# Patient Record
Sex: Female | Born: 1935 | Race: White | Hispanic: No | Smoking: Never smoker
Health system: Southern US, Community
[De-identification: ages and names within clinical notes are randomized; demographics above are authoritative.]

## PROBLEM LIST (undated history)

## (undated) DIAGNOSIS — D649 Anemia, unspecified: Secondary | ICD-10-CM

## (undated) DIAGNOSIS — E119 Type 2 diabetes mellitus without complications: Secondary | ICD-10-CM

## (undated) HISTORY — PX: COLOSTOMY: SHX63

---

## 2008-02-19 ENCOUNTER — Ambulatory Visit: Payer: Self-pay | Admitting: Infectious Disease

## 2008-02-19 DIAGNOSIS — Z9089 Acquired absence of other organs: Secondary | ICD-10-CM

## 2008-02-19 DIAGNOSIS — T8489XA Other specified complication of internal orthopedic prosthetic devices, implants and grafts, initial encounter: Secondary | ICD-10-CM | POA: Insufficient documentation

## 2008-02-19 DIAGNOSIS — A4902 Methicillin resistant Staphylococcus aureus infection, unspecified site: Secondary | ICD-10-CM | POA: Insufficient documentation

## 2008-02-19 LAB — CONVERTED CEMR LAB
ALT: 11 units/L (ref 0–35)
Albumin: 3.8 g/dL (ref 3.5–5.2)
CO2: 25 meq/L (ref 19–32)
CRP: 0.7 mg/dL — ABNORMAL HIGH (ref <0.6)
Glucose, Bld: 88 mg/dL (ref 70–99)
Lymphocytes Relative: 32 % (ref 12–46)
Lymphs Abs: 1.9 10*3/uL (ref 0.7–4.0)
Monocytes Relative: 17 % — ABNORMAL HIGH (ref 3–12)
Neutro Abs: 2.4 10*3/uL (ref 1.7–7.7)
Neutrophils Relative %: 40 % — ABNORMAL LOW (ref 43–77)
Potassium: 4.5 meq/L (ref 3.5–5.3)
RBC: 3.47 M/uL — ABNORMAL LOW (ref 3.87–5.11)
Sed Rate: 81 mm/hr — ABNORMAL HIGH (ref 0–22)
Sodium: 139 meq/L (ref 135–145)
Total Protein: 7.3 g/dL (ref 6.0–8.3)
WBC: 6 10*3/uL (ref 4.0–10.5)

## 2008-02-23 ENCOUNTER — Encounter: Payer: Self-pay | Admitting: Internal Medicine

## 2008-04-19 ENCOUNTER — Ambulatory Visit: Payer: Self-pay | Admitting: Infectious Disease

## 2008-04-19 DIAGNOSIS — M25469 Effusion, unspecified knee: Secondary | ICD-10-CM

## 2008-04-19 LAB — CONVERTED CEMR LAB
ALT: 10 units/L (ref 0–35)
AST: 19 units/L (ref 0–37)
Basophils Absolute: 0 10*3/uL (ref 0.0–0.1)
CO2: 28 meq/L (ref 19–32)
CRP: 1 mg/dL — ABNORMAL HIGH (ref <0.6)
Creatinine, Ser: 1.26 mg/dL — ABNORMAL HIGH (ref 0.40–1.20)
Eosinophils Absolute: 0.2 10*3/uL (ref 0.0–0.7)
Eosinophils Relative: 3 % (ref 0–5)
Lymphocytes Relative: 41 % (ref 12–46)
Lymphs Abs: 2.4 10*3/uL (ref 0.7–4.0)
MCV: 98.2 fL (ref 78.0–100.0)
Neutrophils Relative %: 35 % — ABNORMAL LOW (ref 43–77)
Platelets: 248 10*3/uL (ref 150–400)
RDW: 15.1 % (ref 11.5–15.5)
Sed Rate: 106 mm/hr — ABNORMAL HIGH (ref 0–22)
Sodium: 139 meq/L (ref 135–145)
Total Bilirubin: 0.2 mg/dL — ABNORMAL LOW (ref 0.3–1.2)
Total Protein: 7.1 g/dL (ref 6.0–8.3)
WBC: 5.8 10*3/uL (ref 4.0–10.5)

## 2008-08-10 ENCOUNTER — Inpatient Hospital Stay (HOSPITAL_COMMUNITY): Admission: RE | Admit: 2008-08-10 | Discharge: 2008-08-13 | Payer: Self-pay | Admitting: Orthopedic Surgery

## 2008-08-18 ENCOUNTER — Ambulatory Visit (HOSPITAL_COMMUNITY): Admission: RE | Admit: 2008-08-18 | Discharge: 2008-08-18 | Payer: Self-pay | Admitting: Orthopedic Surgery

## 2008-10-05 ENCOUNTER — Inpatient Hospital Stay (HOSPITAL_COMMUNITY): Admission: RE | Admit: 2008-10-05 | Discharge: 2008-10-08 | Payer: Self-pay | Admitting: Orthopedic Surgery

## 2009-09-05 ENCOUNTER — Inpatient Hospital Stay (HOSPITAL_COMMUNITY): Admission: RE | Admit: 2009-09-05 | Discharge: 2009-09-08 | Payer: Self-pay | Admitting: Orthopedic Surgery

## 2010-09-25 LAB — BASIC METABOLIC PANEL
BUN: 17 mg/dL (ref 6–23)
BUN: 28 mg/dL — ABNORMAL HIGH (ref 6–23)
CO2: 25 mEq/L (ref 19–32)
CO2: 30 mEq/L (ref 19–32)
Chloride: 108 mEq/L (ref 96–112)
Chloride: 109 mEq/L (ref 96–112)
Chloride: 98 mEq/L (ref 96–112)
GFR calc non Af Amer: 43 mL/min — ABNORMAL LOW (ref >60)
GFR calc non Af Amer: 60 mL/min — ABNORMAL LOW (ref >60)
Glucose, Bld: 105 mg/dL — ABNORMAL HIGH (ref 70–99)
Glucose, Bld: 87 mg/dL (ref 70–99)
Potassium: 3.7 mEq/L (ref 3.5–5.1)
Potassium: 3.7 mEq/L (ref 3.5–5.1)
Potassium: 4.4 mEq/L (ref 3.5–5.1)
Sodium: 140 mEq/L (ref 135–145)
Sodium: 140 mEq/L (ref 135–145)

## 2010-09-25 LAB — URINALYSIS, ROUTINE W REFLEX MICROSCOPIC
Bilirubin Urine: NEGATIVE
Glucose, UA: NEGATIVE mg/dL
Hgb urine dipstick: NEGATIVE
Ketones, ur: NEGATIVE mg/dL
Nitrite: NEGATIVE
Specific Gravity, Urine: 1.009 (ref 1.005–1.030)
pH: 6.5 (ref 5.0–8.0)

## 2010-09-25 LAB — CBC
HCT: 28.5 % — ABNORMAL LOW (ref 36.0–46.0)
HCT: 28.6 % — ABNORMAL LOW (ref 36.0–46.0)
HCT: 38.4 % (ref 36.0–46.0)
Hemoglobin: 12.9 g/dL (ref 12.0–15.0)
Hemoglobin: 9.2 g/dL — ABNORMAL LOW (ref 12.0–15.0)
MCHC: 31.9 g/dL (ref 30.0–36.0)
MCV: 97.4 fL (ref 78.0–100.0)
MCV: 98.9 fL (ref 78.0–100.0)
MCV: 99.9 fL (ref 78.0–100.0)
Platelets: 129 10*3/uL — ABNORMAL LOW (ref 150–400)
Platelets: 160 10*3/uL (ref 150–400)
RDW: 13.9 % (ref 11.5–15.5)
RDW: 14.1 % (ref 11.5–15.5)
WBC: 10.2 10*3/uL (ref 4.0–10.5)
WBC: 10.3 10*3/uL (ref 4.0–10.5)

## 2010-09-25 LAB — DIFFERENTIAL
Basophils Absolute: 0 10*3/uL (ref 0.0–0.1)
Eosinophils Absolute: 0.2 10*3/uL (ref 0.0–0.7)
Eosinophils Relative: 4 % (ref 0–5)
Lymphocytes Relative: 32 % (ref 12–46)
Monocytes Absolute: 1 10*3/uL (ref 0.1–1.0)

## 2010-09-25 LAB — GLUCOSE, CAPILLARY
Glucose-Capillary: 127 mg/dL — ABNORMAL HIGH (ref 70–99)
Glucose-Capillary: 137 mg/dL — ABNORMAL HIGH (ref 70–99)
Glucose-Capillary: 171 mg/dL — ABNORMAL HIGH (ref 70–99)

## 2010-09-25 LAB — TYPE AND SCREEN
ABO/RH(D): B NEG
Antibody Screen: NEGATIVE

## 2010-10-11 LAB — GRAM STAIN

## 2010-10-11 LAB — TYPE AND SCREEN

## 2010-10-11 LAB — CBC
Hemoglobin: 9.3 g/dL — ABNORMAL LOW (ref 12.0–15.0)
MCHC: 33.6 g/dL (ref 30.0–36.0)
MCV: 88.7 fL (ref 78.0–100.0)
Platelets: 94 10*3/uL — ABNORMAL LOW (ref 150–400)
Platelets: 97 10*3/uL — ABNORMAL LOW (ref 150–400)
RBC: 2.9 MIL/uL — ABNORMAL LOW (ref 3.87–5.11)
RDW: 27.6 % — ABNORMAL HIGH (ref 11.5–15.5)
WBC: 11.5 10*3/uL — ABNORMAL HIGH (ref 4.0–10.5)

## 2010-10-11 LAB — HEMOGLOBIN AND HEMATOCRIT, BLOOD
HCT: 24.1 % — ABNORMAL LOW (ref 36.0–46.0)
HCT: 25.2 % — ABNORMAL LOW (ref 36.0–46.0)
HCT: 27.4 % — ABNORMAL LOW (ref 36.0–46.0)
Hemoglobin: 8 g/dL — ABNORMAL LOW (ref 12.0–15.0)
Hemoglobin: 8.4 g/dL — ABNORMAL LOW (ref 12.0–15.0)
Hemoglobin: 9.1 g/dL — ABNORMAL LOW (ref 12.0–15.0)

## 2010-10-11 LAB — BASIC METABOLIC PANEL
BUN: 27 mg/dL — ABNORMAL HIGH (ref 6–23)
CO2: 26 mEq/L (ref 19–32)
Calcium: 7.6 mg/dL — ABNORMAL LOW (ref 8.4–10.5)
Calcium: 7.6 mg/dL — ABNORMAL LOW (ref 8.4–10.5)
Creatinine, Ser: 1.1 mg/dL (ref 0.4–1.2)
Creatinine, Ser: 1.21 mg/dL — ABNORMAL HIGH (ref 0.4–1.2)
GFR calc Af Amer: 58 mL/min — ABNORMAL LOW (ref >60)
GFR calc non Af Amer: 43 mL/min — ABNORMAL LOW (ref >60)
GFR calc non Af Amer: 48 mL/min — ABNORMAL LOW (ref >60)
Glucose, Bld: 117 mg/dL — ABNORMAL HIGH (ref 70–99)
Sodium: 133 mEq/L — ABNORMAL LOW (ref 135–145)

## 2010-10-11 LAB — CULTURE, ROUTINE-ABSCESS

## 2010-10-11 LAB — GLUCOSE, CAPILLARY
Glucose-Capillary: 128 mg/dL — ABNORMAL HIGH (ref 70–99)
Glucose-Capillary: 145 mg/dL — ABNORMAL HIGH (ref 70–99)
Glucose-Capillary: 158 mg/dL — ABNORMAL HIGH (ref 70–99)
Glucose-Capillary: 213 mg/dL — ABNORMAL HIGH (ref 70–99)

## 2010-10-11 LAB — ANAEROBIC CULTURE

## 2010-10-12 LAB — URINALYSIS, ROUTINE W REFLEX MICROSCOPIC
Glucose, UA: NEGATIVE mg/dL
Ketones, ur: NEGATIVE mg/dL
Protein, ur: NEGATIVE mg/dL
Urobilinogen, UA: 0.2 mg/dL (ref 0.0–1.0)

## 2010-10-12 LAB — PROTIME-INR: Prothrombin Time: 13.8 seconds (ref 11.6–15.2)

## 2010-10-12 LAB — BASIC METABOLIC PANEL
BUN: 28 mg/dL — ABNORMAL HIGH (ref 6–23)
Chloride: 102 mEq/L (ref 96–112)
Creatinine, Ser: 0.94 mg/dL (ref 0.4–1.2)
GFR calc non Af Amer: 58 mL/min — ABNORMAL LOW (ref >60)
Glucose, Bld: 132 mg/dL — ABNORMAL HIGH (ref 70–99)
Potassium: 5.3 mEq/L — ABNORMAL HIGH (ref 3.5–5.1)

## 2010-10-12 LAB — CBC
HCT: 39.1 % (ref 36.0–46.0)
MCV: 97 fL (ref 78.0–100.0)
Platelets: 199 10*3/uL (ref 150–400)
RDW: 15 % (ref 11.5–15.5)

## 2010-10-12 LAB — DIFFERENTIAL
Basophils Absolute: 0 10*3/uL (ref 0.0–0.1)
Eosinophils Absolute: 0.6 10*3/uL (ref 0.0–0.7)
Eosinophils Relative: 9 % — ABNORMAL HIGH (ref 0–5)
Neutrophils Relative %: 30 % — ABNORMAL LOW (ref 43–77)

## 2010-10-17 LAB — TYPE AND SCREEN
ABO/RH(D): B NEG
Antibody Screen: NEGATIVE
Antibody Screen: NEGATIVE

## 2010-10-17 LAB — VANCOMYCIN, TROUGH: Vancomycin Tr: 17 ug/mL (ref 10.0–20.0)

## 2010-10-17 LAB — BASIC METABOLIC PANEL
BUN: 23 mg/dL (ref 6–23)
CO2: 25 mEq/L (ref 19–32)
CO2: 30 mEq/L (ref 19–32)
Calcium: 8 mg/dL — ABNORMAL LOW (ref 8.4–10.5)
Calcium: 8.3 mg/dL — ABNORMAL LOW (ref 8.4–10.5)
Chloride: 100 mEq/L (ref 96–112)
Creatinine, Ser: 1.04 mg/dL (ref 0.4–1.2)
GFR calc Af Amer: >60 mL/min (ref >60)
GFR calc Af Amer: >60 mL/min (ref >60)
GFR calc non Af Amer: 60 mL/min — ABNORMAL LOW (ref >60)
GFR calc non Af Amer: >60 mL/min (ref >60)
Glucose, Bld: 126 mg/dL — ABNORMAL HIGH (ref 70–99)
Glucose, Bld: 84 mg/dL (ref 70–99)
Sodium: 135 mEq/L (ref 135–145)
Sodium: 136 mEq/L (ref 135–145)

## 2010-10-17 LAB — URINALYSIS, ROUTINE W REFLEX MICROSCOPIC
Bilirubin Urine: NEGATIVE
Glucose, UA: NEGATIVE mg/dL
Hgb urine dipstick: NEGATIVE
Ketones, ur: NEGATIVE mg/dL
Protein, ur: NEGATIVE mg/dL

## 2010-10-17 LAB — CBC
Hemoglobin: 7.4 g/dL — CL (ref 12.0–15.0)
Hemoglobin: 8.7 g/dL — ABNORMAL LOW (ref 12.0–15.0)
MCHC: 33.6 g/dL (ref 30.0–36.0)
MCV: 97.8 fL (ref 78.0–100.0)
Platelets: 151 10*3/uL (ref 150–400)
Platelets: 202 10*3/uL (ref 150–400)
RBC: 2.24 MIL/uL — ABNORMAL LOW (ref 3.87–5.11)
RBC: 3.39 MIL/uL — ABNORMAL LOW (ref 3.87–5.11)
RDW: 14 % (ref 11.5–15.5)
WBC: 8.7 10*3/uL (ref 4.0–10.5)

## 2010-10-17 LAB — DIFFERENTIAL
Lymphocytes Relative: 27 % (ref 12–46)
Lymphs Abs: 1.7 10*3/uL (ref 0.7–4.0)
Neutrophils Relative %: 53 % (ref 43–77)

## 2010-10-17 LAB — CULTURE, ROUTINE-ABSCESS

## 2010-10-17 LAB — PROTIME-INR
INR: 1.1 (ref 0.00–1.49)
Prothrombin Time: 15 seconds (ref 11.6–15.2)

## 2010-10-17 LAB — GRAM STAIN

## 2010-10-17 LAB — ANAEROBIC CULTURE

## 2010-10-17 LAB — APTT: aPTT: 32 seconds (ref 24–37)

## 2010-10-17 LAB — GLUCOSE, CAPILLARY: Glucose-Capillary: 82 mg/dL (ref 70–99)

## 2010-11-14 NOTE — Discharge Summary (Signed)
[name], [name]                ACCOUNT NO.:  [id number]   MEDICAL RECORD NO.:  [id number]          PATIENT TYPE:  INP   LOCATION:  1618                         FACILITY:  [facility]   PHYSICIAN:  Madlyn Frankel. Charlann Boxer, M.D.  DATE OF BIRTH:  1929-06-12   DATE OF ADMISSION:  10/05/2008  DATE OF DISCHARGE:  10/08/2008                               DISCHARGE SUMMARY   ADMISSION DIAGNOSES:  1. Osteoarthritis.  2. Hypertension.  3. Diabetes.  4. Anemia.  5. Deep venous thrombosis in her neck.  6. Colorectal cancer.  7. Rheumatoid arthritis.  8. Staph infections.   DISCHARGE DIAGNOSES:  1. Osteoarthritis.  2. Hypertension.  3. Diabetes.  4. Anemia.  5. Deep venous thrombosis in her neck.  6. Colorectal cancer.  7. Rheumatoid arthritis.  8. Staph infections.  9. Acute blood loss anemia.   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  She was admitted to the  hospital after long-term oral and IV antibiotics for reimplantation of a  right total knee replacement.   PROCEDURE:  Reimplantation of her right total knee replacement.   SURGEON:  Madlyn Frankel. Charlann Boxer, M.D.   ASSISTANT:  Yetta Glassman. Mann, PA-C.   CONSULTATION:  None.   LABORATORY DATA:  CBC:  Hemoglobin/hematocrit drawn on October 08, 2008  showed hemoglobin 8.4, hematocrit 25.2.  She was retransfused 2 units of  packed red blood cells prior to discharge.  Metabolic panel:  Sodium  130, potassium 3.5, BUN 27, creatinine 1.1, glucose 131.  Culture  abscess showed no growth after 3 days.  No anaerobes isolated from wound  culture.   DIAGNOSTICS:  Chest two view showed mild cardiomegaly, no active  process.   HOSPITAL COURSE:  The patient admitted to the hospital for  reimplantation of right total knee replacement.  Surgeon was Dr. Durene Romans.  Assistant was [employer]riott.  During her course of stay, she did  have some acute blood loss anemia and was  transfused several units of  blood.  She has chronic anemia.  Dressing was changed from her wound.  No significant drainage from her wound.  No significant bruising.  No  sign of hematoma or infection.  She did remain neurovascularly intact of  her right lower extremity throughout her course of stay.  She did have  deconditioned quads, but did have improving quad firing.  We  discontinued her PCA on October 07, 2008 as well as her MiraLax.  When  seen on October 08, 2008, wound looked good with no significant drainage.  She was afebrile and feeling better than she was the previous day.  We  are going to recheck her H and H which we did, it showed decreasing  hematocrit at 25.2.  We were going to retransfuse her 2 units of packed  red blood cells and then discharge her to skilled nursing facility late  in the day.   DISCHARGE DISPOSITION:  Discharged to skilled nurse facility rehab in  stable and improved condition.   DISCHARGE  DIET:  Heart healthy.   DISCHARGE WOUND CARE:  Keep dry.   DISCHARGE PHYSICAL THERAPY:  She is weightbearing as tolerated with the  use of a rolling walker.  Goals range of motion 0-120 as well as  increased quad strength.  She is weak and will require significant  assistance in achieving goals as well as working on her balance  exercises.   DISCHARGE MEDICATIONS:  1. Lovenox 40 mg subcutaneous q.24 h. x 10 days.  2. Then start enteric-coated aspirin 325 mg 1 p.o. daily x4 weeks      after Lovenox completed.  3. Robaxin 500 mg 1 p.o. q.6 h. p.r.n. muscle spasm and pain.  4. Cyanocobalamin 100,000 mg injection monthly.  5. Magnesium 400 mg 1 p.o. q.a.m.  6. Metoprolol 50 mg 1 p.o. q.a.m.  7. Omeprazole 20 mg 1 p.o. q.p.m.  8. Potassium chloride 20 mEq p.o. daily.  9. Simvastatin 40 mg p.o. q.p.m.  10.Calcium plus vitamin D b.i.d.  11.Colace b.i.d. p.r.n.  12.Iron 325 mg t.i.d.  13.Lyrica 50 mg 1 p.o. t.i.d.  14.Tylenol 325 mg up to 4 times daily as  needed.  15.Oxycodone 5 mg 1-3 p.o. q.3-4 h. p.r.n. pain.   DISCHARGE FOLLOWUP:  Follow up with Dr. Charlann Boxer at phone number [phone] in  2 weeks for wound check.     ______________________________  Yetta Glassman. [name], [name]      Madlyn Frankel. Charlann Boxer, M.D.  Electronically Signed    BLM/MEDQ  D:  10/08/2008  T:  10/08/2008  Job:  [id number]

## 2010-11-14 NOTE — H&P (Signed)
[name], [name]                ACCOUNT NO.:  [id number]   MEDICAL RECORD NO.:  [id number]         PATIENT TYPE:  LINP   LOCATION:                               FACILITY:  [facility]   PHYSICIAN:  Madlyn Frankel. Charlann Boxer, M.D.  DATE OF BIRTH:  1928/07/13   DATE OF ADMISSION:  07/19/2008  DATE OF DISCHARGE:                              HISTORY & PHYSICAL   PROCEDURE:  Resection of an infected right total knee replacement.   CHIEF COMPLAINTS:  Right knee infection and pain.   HISTORY OF PRESENT ILLNESS:  A 77 year old female with a history of  right knee infection.  It has been refractory to all conservative  treatment.  She had been on some long-term oral antibiotics without  success.  She is a resident of [address].   PRIMARY CARE PHYSICIAN:  Dr. [name].   PAST MEDICAL HISTORY:  1. Ostearthritis.  2. Hypertension.  3. Diabetes.  4. Anemia.  5. DVT in her neck.  6. Colorectal cancer.  7. Rheumatoid arthritis.  8. Staph infections.   PAST SURGICAL HISTORY:  1. In 1997 colorectal/colostomy.  2. Knee replacement in 2001.  3. Spleen and gallbladder surgery in 1997.  4. Cataract surgery in 1999.  5. Hysterectomy in 1983.   FAMILY HISTORY:  Noncontributory.   SOCIAL HISTORY:  Widowed, resident at [facility], [place].   DRUG ALLERGIES:  No known drug allergies.   MEDICATIONS:  1. Calcium 600 mg 1 b.i.d.  2. Doxycycline 100 mg 1 b.i.d.  3. Vitamin B12 injection q. monthly 6000 mcg.  4. HCTZ 25 mg p.o. daily.  5. Lyrica 15 mg 1 t.i.d.  6. Magnesium oxide 400 mg 1 q.i.d.  7. Metoprolol 50 mg ER 1 daily.  8. Omeprazole 20 mg 1 daily.  9. Potassium chloride 20 mEq 1 daily.  10.Simvastatin 40 mg 1 q.h.s.  11.Maalox p.r.n. up to 4 times a day.  12.Imodium 1 tablet up to 6 doses daily.  13.Darvocet N 100 1 q.6 pain  14.Triamcinolone cream (Aristocort) daily for rash as needed.   REVIEW OF SYSTEMS:  HEENT:  She wears dentures.  She has ringing in her   ears.  GENITOURINARY:  She has increased urinary frequency.  MUSCULOSKELETAL:  She has joint swelling of her feet.  Otherwise see  HPI.   PHYSICAL EXAMINATION:  VITAL SIGNS:  Pulse 54, respirations 16, blood  pressure 150/70.  GENERAL:  Wake alert and oriented, utilizes a wheelchair.  HEENT:  Normocephalic.  NECK:  Supple.  No carotid bruits.  CHEST:  Lungs clear to auscultation bilaterally.  BREASTS:  Deferred.  HEART:  Regular rate and rhythm.  S1-S2 distinct.  ABDOMEN:  Soft, nontender, nondistended.  Bowel sounds present.  PELVIC:  Stable.  GENITOURINARY:  Deferred.  EXTREMITIES:  Right knee is swollen and painful.  SKIN:  Warm to touch.  NEUROLOGIC:  Intact distal sensibilities.   LABORATORY DATA:  Labs, EKG, chest x-ray all pending.   IMPRESSION:  Infected right total knee replacement.   PLAN OF ACTION:  Resection right total knee replacement at [facility], July 19, 2008, by Dr. Durene Romans.  Risks and complications were discussed.   She is planning on a rehab stay postoperatively.     ______________________________  [name], [name]      Madlyn Frankel. Charlann Boxer, M.D.  Electronically Signed    BLM/MEDQ  D:  07/09/2008  T:  07/09/2008  Job:  [id number]   cc:   Darrick Huntsman [place]

## 2010-11-14 NOTE — Op Note (Signed)
[name], [name]                ACCOUNT NO.:  [id number]   MEDICAL RECORD NO.:  [id number]          PATIENT TYPE:  INP   LOCATION:  0002                         FACILITY:  [facility]   PHYSICIAN:  Madlyn Frankel. Charlann Boxer, M.D.  DATE OF BIRTH:  1928/10/18   DATE OF PROCEDURE:  10/05/2008  DATE OF DISCHARGE:                               OPERATIVE REPORT   PREOPERATIVE DIAGNOSIS:  Status post resection of a right total knee  that was infected.   POSTOPERATIVE DIAGNOSIS:  Status post resection of a right total knee  that was infected.   PROCEDURE:  Reimplantation of a right total knee replacement utilizing  DePuy components with a size 3 TC3 femur, a size 31 Porocoat Universal  femoral distal sleeve with a +2 offset adapter bolt and a 5 degree  adapter with a 60-mm sleeve.  On the tibial side was a size 3 tibial  component with a 15 mm size 2 medial wedge, a 10 mm size 3 lateral wedge  and a 29 mm cemented sleeve with a 60-mm stem.  On the femoral side, I  did use an 8 mm posterior medial augment, as well as an 8 mm distal  lateral augment and a 12.5 TC3 insert and a 38 patellar button.  I did  use 4 bags of cement and 2 grams of vancomycin for prophylaxis.   SURGEON:  Madlyn Frankel. Charlann Boxer, M.D.   ASSISTANT:  Yetta Glassman. [name], PA.   ANESTHESIA:  General.   SPECIMENS:  Joint fluid was sent for Gram stain.  At the end of the  case, there was no evidence of bacterial growth, minimal white cells.   BLOOD LOSS:  600 mL.   TOURNIQUET TIME:  35 minutes at 250 mmHg.   DRAINS:  One Hemovac.   INDICATIONS FOR PROCEDURE:  Ms. Ketner is a 77 year old female who is  currently almost 2 months out from resection of a right total knee,  where there was concern for infection and placement of antibiotic  spacer.  She received 6 weeks of IV antibiotics and scheduled for  surgery 2-3 weeks post antibiotic cessation.  She has been afebrile with  no recurrence of her swelling and is ready for planned  procedure.  The  risks of recurrence of infection, DVT, component failure, need for  revision and stiffness were all reviewed and discussed, as well as the  need for potential revision surgery.  Consent was obtained for the  benefit of pain relief and improved function.   PROCEDURE IN DETAIL:  The patient was brought to the operative theater.  Once adequate anesthesia, preoperative antibiotics were held until the  incision time.  The right lower extremity was prepped with a proximal  thigh tourniquet.  We then pre-scrubbed and prepped and draped the right  leg in the leg holder.   A timeout was performed identifying the planned procedure, the patient  and extremity.   The leg was exsanguinated and tourniquet was initially elevated to 250  mmHg.  I made my initial incision a little bit further proximal distal  than the initial incision.  Sharp dissection was carried down to the  extensor mechanism.  It was about this time that I recognized that there  was more of a venous tourniquet, so I decided to let the tourniquet down  after just a few minutes.   Soft tissue planes were created along the extensor mechanism, then  arthrotomy carried out.  There was noted to be bloody hemarthrosis, but  no evidence of infection.  Gram stain culture was taken at this point.  Antibiotics were given at this point.   Following exposure, the cement block was removed without difficulty or  complication.  Following further exposure, I was able to flex the knee  up.  Initial attention was directed to the patella.  I debrided scar  tissue around the patella.  I used the curette to define the surface of  the patella.  I used an oscillating saw to remove some of the distal  bone that had measured approximately 18 mm.  I resected it down to 14  mm, giving a decent surface for size 38 patellar button.  Lug holes were  drilled.  I placed a patella button on this to protect the cut surface  from retractors and  saw blades.   The knee was then flexed forward following debridement.  The canals were  identified both on the femoral and tibial side and then reamed up to 15  mm on each side for cemented stems of 13 mm diameter.   Attention was first directed to the tibia side.  Given the defect that  was noted, as well as the potential need for augment, I went ahead and  prepared the tibia for a sleeve.  I impacted this down and using trial  components, determined that I was going to need at least a 10 mm augment  on the medial side and a 5 lateral to get contact, as well as to keep  the tibial component within a perpendicular plane and no varus.   Given this preparation of the tibia, I did initially trial with a 10 mm  augment on the medial side and a 5 mm on the lateral side.  A 30 mm  femoral stem with a 29-mm sleeve placed directly anterior.   Following this, attention was now directed to the femur.  I used a 13 mm  intramedullary reamer into the canal with a 14 mm distal sleeve.  I  checked, there was significant bone loss on the lateral side of the  femur.  There was a distal medial portion of the femur intact that I  utilized as a reference for my joint.  Based on these findings, I  determined that I was going to need to use a sleeveless component to  maintain rotational control and support.   Given this, I went ahead and prepared the femur for a sleeve drilling  the proximal femur carefully and then using a broach to impact it at the  appropriate level for a TC3 component.  There was noted to be a severe  gap on the lateral side that was bone deficient, which I was fine with  this at this point.   I then checked, there was no anterior cut to be made.  There was no real  posterior cut to be made, and I determined I would use an 8 mm posterior  medial augment in order to help with rotation based off the plane of the  tibial component.   At this point, we used a  60 mm stem, a 31 mm  sleeve, a +2 adapter, this  8 mm posterolateral medial augment.  With this, I did a trial reduction.  The femoral component sat basically at the end of this distal femoral  bone structure and the tibia component was then placed.  It was at least  up to 15 with some hyperextension.  At this point, I made the  determination that I would increase the augments on the tibial side  rather than to try to increase the joint surface because I needed some  bony contact on the posterior aspect of the remaining femur for  rotational control.   At this point, all trial components were removed.  With the trial  components in place, I did at this point re-exsanguinate the leg and  elevate the tourniquet to 250 mmHg.  We spent time with a canal brush  irrigating the canals first, the canals on both the femoral and tibial  side.  I then put cement restrictor size 4, as appropriate depth based  on the trials.   A brush irrigator was then used to irrigate the knee out as the final  components were opened and prepared by myself on the back table.  Once  these were prepared, cement was mixed, again four batches of cement and  2 grams of vancomycin.  The tibial component was cemented in first  followed by the femur.  The knee was brought to extension with a 12.5  insert.  With this, the knee was had good extension and was stable from  extension to flexion.   The extruded cement was removed.  Once the cement had fully cured,  excessive cement was removed throughout the knee.  I made a decision  based on this 77 year old female to go ahead and use a size 12.5 TC3  tibial insert to provide more support to the knee.   The knee was then irrigated with normal saline solution.  The tourniquet  was let down after the cement was in place.  We did place a medium  Hemovac drain.  The extensor mechanism was then reapproximated with the  knee in approximately 30 degrees of flexion with #1 Vicryl.  The  remaining  wound was closed with 2-0 Vicryl and staples on the skin.  The  skin was cleaned, dried and dressed sterilely with a bulky sterile wrap.  She was brought to the recovery room in stable condition tolerating the  procedure well.      Madlyn Frankel Charlann Boxer, M.D.  Electronically Signed     MDO/MEDQ  D:  10/05/2008  T:  10/05/2008  Job:  [id number]

## 2010-11-14 NOTE — Discharge Summary (Signed)
[name], [name]                ACCOUNT NO.:  [id number]   MEDICAL RECORD NO.:  [id number]          PATIENT TYPE:  INP   LOCATION:  1614                         FACILITY:  [facility]   PHYSICIAN:  Madlyn Frankel. Charlann Boxer, M.D.  DATE OF BIRTH:  1929/05/07   DATE OF ADMISSION:  08/10/2008  DATE OF DISCHARGE:  08/13/2008                               DISCHARGE SUMMARY   ADMITTING DIAGNOSES:  1. Infected right total knee.  2. Hypertension.  3. Reflux disease.  4. Dyslipidemia.  5. Diabetes.  6. Colorectal cancer.  7. Rheumatoid arthritis.  8. History of blood clot in neck.  9. Postmenopausal.   DISCHARGE DIAGNOSIS:  1. Infected with a resected right total knee on IV antibiotics.  2. Hypertension.  3. Dyslipidemia.  4. Reflux disease.  5. Diabetes.  6. Colorectal cancer.  7. Rheumatoid arthritis.  8. History of blood clot in neck.  9. Postmenopausal.   HISTORY OF PRESENT ILLNESS:  A 77 year old female with a history of  right total knee replacement and subsequent infection.  She was admitted  to the hospital for resection of right total knee as well as PICC line  placement for long-term IV antibiotics.   CONSULTANTS:  Pharmacy for Vanc trough management.   Procedure was a resection of right total knee replaced by surgeon Dr.  Durene Romans.  Assistant Dwyane Luo, PA-C.   LABORATORY DATA:  Final CBC white blood cell 8.7, hemoglobin 11.7,  hematocrit 33.9, platelets 136.  She did have a transfusion due to acute  blood loss anemia.   Metabolic panel sodium 136, potassium 4, BUN 15, creatinine 0.9, glucose  112.  Her lytes showed her calcium of 8.  Last Vanc trough taken on the  August 12, 2008 was 17.   Radiology chest 2-view showed mild cardiomegaly, unfolded aorta, no  active processes.   Cardiology EKG showed a regular rhythm with first-degree AV block, left  axis deviation.   HOSPITAL COURSE:  The patient was admitted to the hospital and underwent  a resection of right total  knee. Admitted to orthopedic floor.  She did  have acute blood loss anemia. She was transfused 2 units. She came back  up to within lower limits of normal.  Dressing was changed on a daily  basis afterwards.  She had no significant drainage from wound.  No sign  of infection.  She had intact staples.  She had physical therapy.  She  was touchdown weightbearing and made very moderate progress with ability  to transfer.  Seen on day 3 she was stable.  We changed her pain  medicines over from Norco to oxycodone to help. Her Vanc trough was  therapeutic.  She was stable and ready for discharge home, afebrile.   DISCHARGE DISPOSITION:  Discharged to skilled nurse facility rehab,  stable improved condition.  She is on long-term IV antibiotics.   DISCHARGE DIET:  Heart-healthy.   DISCHARGE WOUND CARE:  Keep wound dry. Change dressing on a daily basis.  The patient may shower if cover with plastic or other impervious  substance and tape edges.  DISCHARGE PHYSICAL THERAPY:  She is touchdown on weightbearing with the  use of rolling walker. Right knee should be in a knee immobilizer at all  times.  No range of motion of right knee.   DISCHARGE MEDICATIONS:  1  Lovenox 40 mg subcu q.24h. x10 days.  1. Robaxin 500 mg 1 p.o. q.6h.  2. Iron 325 mg 1 p.o. t.i.d. x3 weeks.  3. Colace 100 mg p.o. b.i.d.  4. MiraLax 17 grams p.o. daily.  5. Enteric-coated aspirin 325 mg 1 p.o. daily x4 weeks after Lovenox      completed.  6. Oxycodone 5 mg 1-3 p.o. q.3-4h. p.r.n. pain.  7. Tylenol 650 mg 1  p.o. q.6h.  8. Rifampin 300 mg 1 p.o. b.i.d. x6 weeks.  9. Vancomycin 750 mg IV q.12h. x6 weeks to maintain Vanc trough      between 10 and 20.  10.Calcium 600 mg p.o. b.i.d.  11.Cyanocobalamin IV injection of vitamin B12 IM monthly.  12.HCTZ 25 mg 1 daily in the morning.  13.Lyrica 50 mg t.i.d.  14.Magnesium 400 mg 4 times daily give to least 2 hours before or      after other medicines.  15.Metoprolol ER  550 mg 1 p.o. daily  16.Omeprazole 20 mg p.o. daily.  17.Potassium chloride 20 mEq p.o. daily.  18.Simvastatin 40 mg p.o. q.h.s.   DISCHARGE INSTRUCTIONS:  1. Recheck CBC, BMET weekly.  2. Return to see Dr. Charlann Boxer at phone number [phone] in 2 weeks for      wound check and staple removal.  3. Direct all orthopedic questions to Dr. Charlann Boxer, any medical questions      to her primary care physician, Dr. [name] in [facility].  His number      is [phone].     ______________________________  Yetta Glassman. [name], [name]      Madlyn Frankel. Charlann Boxer, M.D.  Electronically Signed    BLM/MEDQ  D:  08/13/2008  T:  08/13/2008  Job:  [id number]

## 2010-11-14 NOTE — Op Note (Signed)
NAMEJALON, BLACKWELDER                ACCOUNT NO.:  [id number]   MEDICAL RECORD NO.:  [id number]          PATIENT TYPE:  INP   LOCATION:  1614                         FACILITY:  [facility]   PHYSICIAN:  Madlyn Frankel. Charlann Boxer, M.D.  DATE OF BIRTH:  1929-02-17   DATE OF PROCEDURE:  08/10/2008  DATE OF DISCHARGE:                               OPERATIVE REPORT   PREOPERATIVE DIAGNOSIS:  Infected right total knee replacement.   POSTOPERATIVE DIAGNOSIS:  Infected right total knee replacement.   PROCEDURE:  Resection, right total knee replacement, with I and D and  complete synovectomy post placement of an antibiotic cement spacer.   SURGEON:  Madlyn Frankel. Charlann Boxer, M.D.   ASSISTANT:  Dwyane Luo, PA-C.   ANESTHESIA:  General.   BLOOD LOSS:  150 mL.  A tourniquet was utilized for 45 minutes at 250  mmHg.   COMPLICATIONS:  None.   DRAINS:  1 medium Hemovac.   SPECIMEN:  Joint fluid was sent to the laboratory for evaluation, a stat  Gram stain culture.  No report at the time of wound closure.   INDICATIONS FOR PROCEDURE:  Ms. Sherwood is a pleasant 77 year old female  referred for surgical consideration of a painful knee.  Lab work and  radiographs had indicated a loose and infected knee based on lab values.  She had already been on antibiotics, confounding any potential option of  an aspiration.  Based on the persistence of her symptoms despite  antibiotics, lab results, etc., she was planned for resection  arthroplasty.  I reviewed the risks and benefits and the plan of a two-  stage reimplantation-type procedure.  Consent was obtained.   PROCEDURE IN DETAIL:  The patient was brought to the operative theater.  Once adequate anesthesia was established and a gram of vancomycin  initiated, the patient was positioned supine on the operative table.  A  thigh tourniquet was placed.  The right lower extremity was pre scrubbed  and prepped and draped in a sterile fashion.   The patient's old incision was  utilized.  I excised the upper portion  which had to started to spread a bit.  I excised the old scar down to  the tibial tubercle.  Sharp dissection was down to the extensor  mechanism, recreating the boundary.  Arthrotomy was carried out and a  large effusion noted.  Synovial fluid was sent for stat Gram stain and  culture.   At this point I began to debriding the knee with a synovectomy.  It was  very obvious how loose the femoral component was.  Once I completed the  synovectomy in the anterior, medial, and lateral aspects of the knee, I  used an osteotome to remove the rotating platform insert.  With this,  the femoral component popped out.   Extensive debridement of the soft tissues around the knee was carried  out and the femoral component was carried out at this point.  At that  point I used a thin oscillating saw to elevate underneath the anterior,  medial and lateral aspects of the proximal tibia.  Following this, I  used an osteotome and was able to easily elevate the tibial component  off of the tibia.   I then used a drill to drill through the tibial cement plug area,  removing that cement.  Once I was satisfied with my debridement of the  synovium over the anterior, medial, lateral and posterior aspect of the  knee in addition to the cement, we irrigated the knee, including the  canals, with a canal brush irrigator with 6 liters of normal saline  solution.  While this was going on, cement was mixed on the back table  with 3 grams of vancomycin and 3 batches of tobramycin mixed in with 3  batches of cement.  This was then formed into a block that basically fit  the space that was determined to be between 4.5 cm wide in the extension  joint space.  Once it had totally cured and had gone through its  exothermic response, I placed it into the knee.   At this point we irrigated the knee again with 1 liter of normal saline  solution.  I placed a medium Hemovac drain deep.   The extensor mechanism  was then closed over this with #1 PDS sutures, 2-0 Vicryl in the subcu  layer, and staples on the skin.  The skin was cleaned, dried and dressed  sterilely with a Xeroform and a bulky sterile dressing.  She was then  brought to the recovery room in stable condition, extubated, tolerating  the procedure well.      Madlyn Frankel Charlann Boxer, M.D.  Electronically Signed     MDO/MEDQ  D:  08/10/2008  T:  08/10/2008  Job:  [id number]

## 2010-11-14 NOTE — H&P (Signed)
[name], [name]                ACCOUNT NO.:  [id number]   MEDICAL RECORD NO.:  [id number]          PATIENT TYPE:  INP   LOCATION:  NA                           FACILITY:  [facility]   PHYSICIAN:  Madlyn Frankel. Charlann Boxer, M.D.  DATE OF BIRTH:  04-27-1929   DATE OF ADMISSION:  10/05/2008  DATE OF DISCHARGE:                              HISTORY & PHYSICAL   PROCEDURE:  Reimplantation of a right total knee replacement.   CHIEF COMPLAINT/REASON FOR ADMISSION:  Resected right total knee  replacement.   HISTORY OF PRESENT ILLNESS:  A 77 year old female with a history of  infected right total knee replacement that was resected on July 19, 2008.  She has undergone 6 weeks of IV antibiotics as well as oral  antibiotics.  She reports no fevers, chills.  She has worn a knee  immobilizer and wound has healed up well.  She has remained in a nursing  facility rehab during the course of IV antibiotic treatment.   PAST MEDICAL HISTORY:  1. Osteoarthritis.  2. Hypertension.  3. Diabetes.  4. Anemia.  5. DVT in her neck.  6. Colorectal cancer.  7. Rheumatoid arthritis.  8. Staph infections.   PAST SURGICAL HISTORY:  1. Resection of her right total knee replacement July 19, 2008.  2. In 1997, colorectal colostomy.  3. Primary knee replacement in 2001.  4. Spleen and gallbladder surgery in 1997.  5. Cataract surgery in 1999.  6. Hysterectomy in 1983.   FAMILY HISTORY:  Noncontributory.   SOCIAL HISTORY:  She is widowed.  She was a resident at [facility]  in [place] prior to resection.   DRUG ALLERGIES:  NO KNOWN DRUG ALLERGIES.   MEDICATIONS:  1. Aspirin 325 mg p.o. daily.  2. Cyanocobalamin 1000 mcg injection q.30 days on the 14th.  3. HCTZ 25 mg 1 p.o. daily.  4. Magnesium 400 mg 1 p.o. daily at least 2 hours before or after      other medications.  5. Metoprolol 50 mg p.o. daily.  6. MiraLax 17 gm p.o. daily.  7. Omeprazole 20 mg p.o. daily.  8. Potassium chloride 20 mEq  p.o. daily.  9. Simvastatin 40 mg p.o. daily.  10.Calcium plus vitamin D 600/200 p.o. daily.  11.Colace 100 mg p.o. b.i.d.  12.Iron 325 mg 1 p.o. t.i.d.  13.Nystatin/triamcinolone cream applied to groin area up to 3 times      daily as needed for redness and irritation.  14.Robaxin 500 mg p.o. q.6 h.  15.Lyrica 50 mg p.o. t.i.d.  16.Tylenol 325 mg p.o. q.6 h. p.r.n.  17.Oxycodone 5 mg 1-2 tablets q.4 h. p.r.n. severe pain.   REVIEW OF SYSTEMS:  HEENT:  She wears dentures.  GENITOURINARY:  She has  increased urinary frequency.  MUSCULOSKELETAL:  See HPI.   PHYSICAL EXAMINATION:  VITAL SIGNS:  Pulse 72, respirations 16, blood  pressure 120/74.  GENERAL:  Awake, alert and oriented.  HEENT:  Normocephalic.  NECK:  Supple.  No carotid bruits.  CHEST/LUNGS:  Clear to auscultation bilaterally.  BREASTS:  Deferred.  HEART:  S1-S2 distinct.  ABDOMEN:  Soft, nontender, nondistended.  Bowel sounds present.  PELVIS:  Stable.  GENITOURINARY:  Deferred.  EXTREMITIES:  Right knee quads do fire.  SKIN:  Wound well healed on right knee.  No signs of cellulitis or  infection.  NEUROLOGIC:  She has intact distal sensibilities.   LABORATORY DATA:  Labs, EKG, chest x-ray are all pending.   IMPRESSION:  Resected right total knee replacement.   PLAN OF ACTION:  Reimplantation of right total knee replacement by Dr.  Charlann Boxer at [facility], October 05, 2008.  Risks and complications were  discussed.     ______________________________  [name], [name]      Madlyn Frankel. Charlann Boxer, M.D.  Electronically Signed    BLM/MEDQ  D:  09/23/2008  T:  09/23/2008  Job:  [id number]

## 2017-09-19 ENCOUNTER — Encounter: Payer: Medicare Other | Attending: Nurse Practitioner | Admitting: Nurse Practitioner

## 2017-09-19 DIAGNOSIS — L89154 Pressure ulcer of sacral region, stage 4: Secondary | ICD-10-CM | POA: Diagnosis present

## 2017-09-19 DIAGNOSIS — E785 Hyperlipidemia, unspecified: Secondary | ICD-10-CM | POA: Diagnosis not present

## 2017-09-19 DIAGNOSIS — Z933 Colostomy status: Secondary | ICD-10-CM | POA: Insufficient documentation

## 2017-09-19 DIAGNOSIS — E11622 Type 2 diabetes mellitus with other skin ulcer: Secondary | ICD-10-CM | POA: Insufficient documentation

## 2017-09-19 DIAGNOSIS — M069 Rheumatoid arthritis, unspecified: Secondary | ICD-10-CM | POA: Insufficient documentation

## 2017-09-19 DIAGNOSIS — E43 Unspecified severe protein-calorie malnutrition: Secondary | ICD-10-CM | POA: Insufficient documentation

## 2017-09-19 DIAGNOSIS — I10 Essential (primary) hypertension: Secondary | ICD-10-CM | POA: Insufficient documentation

## 2017-09-19 DIAGNOSIS — Z85048 Personal history of other malignant neoplasm of rectum, rectosigmoid junction, and anus: Secondary | ICD-10-CM | POA: Diagnosis not present

## 2017-09-19 DIAGNOSIS — Z993 Dependence on wheelchair: Secondary | ICD-10-CM | POA: Insufficient documentation

## 2017-09-20 NOTE — Progress Notes (Addendum)
[name] ([id number]) Visit Report for 09/19/2017 Abuse/Suicide Risk Screen Details Patient Name: [name]. Date of Service: 09/19/2017 9:45 AM Medical Record Number: [id number] Patient Account Number: [id number] Date of Birth/Sex: [birth year] (84 y.o. Female) Treating RN: Curtis Sites Primary Care Fredderick Swanger: Lois Huxley Other Clinician: Referring Benn Tarver: Daiva Eves, CORNELIUS Treating Lateya Dauria/Extender: Kathreen Cosier in Treatment: 0 Abuse/Suicide Risk Screen Items Answer ABUSE/SUICIDE RISK SCREEN: Has anyone close to you tried to hurt or harm you recentlyo No Do you feel uncomfortable with anyone in your familyo No Has anyone forced you do things that you didnot want to doo No Do you have any thoughts of harming yourselfo No Patient displays signs or symptoms of abuse and/or neglect. No Electronic Signature(s) Signed: 09/19/2017 9:42:00 AM By: Curtis Sites Entered By: Curtis Sites on 09/19/2017 09:42:00 [name] ([id number]) -------------------------------------------------------------------------------- Activities of Daily Living Details Patient Name: [name]. Date of Service: 09/19/2017 9:45 AM Medical Record Number: [id number] Patient Account Number: [id number] Date of Birth/Sex: 12/13/1928 (84 y.o. Female) Treating RN: Curtis Sites Primary Care Tria Noguera: Lois Huxley Other Clinician: Referring Keli Buehner: Daiva Eves, CORNELIUS Treating Demetri Goshert/Extender: Kathreen Cosier in Treatment: 0 Activities of Daily Living Items Answer Activities of Daily Living (Please select one for each item) Drive Automobile Not Able Take Medications Need Assistance Use Telephone Completely Able Care for Appearance Need Assistance Use Toilet Need Assistance Bath / Shower Need Assistance Dress Self Need Assistance Feed Self Completely Able Walk Need Assistance Get In / Out Bed Need Assistance Housework Need Assistance Prepare Meals Need Assistance Handle  Money Need Assistance Shop for Self Need Assistance Electronic Signature(s) Signed: 09/19/2017 9:43:16 AM By: Curtis Sites Entered By: Curtis Sites on 09/19/2017 09:43:15 [name] ([id number]) -------------------------------------------------------------------------------- Education Assessment Details Patient Name: [name] Date of Service: 09/19/2017 9:45 AM Medical Record Number: [id number] Patient Account Number: [id number] Date of Birth/Sex: [birth year] (84 y.o. Female) Treating RN: Curtis Sites Primary Care Cranston Koors: Lois Huxley Other Clinician: Referring Lulu Hirschmann: Daiva Eves, CORNELIUS Treating Priscella Donna/Extender: Kathreen Cosier in Treatment: 0 Primary Learner Assessed: Caregiver SNF nurses Reason Patient is not Primary Learner: wound location Learning Preferences/Education Level/Primary Language Learning Preference: Printed Material Highest Education Level: College or Above Preferred Language: English Cognitive Barrier Assessment/Beliefs Language Barrier: No Translator Needed: No Memory Deficit: No Emotional Barrier: No Cultural/Religious Beliefs Affecting Medical Care: No Physical Barrier Assessment Impaired Vision: No Impaired Hearing: No Decreased Hand dexterity: No Knowledge/Comprehension Assessment Knowledge Level: Medium Comprehension Level: Medium Ability to understand written Medium instructions: Ability to understand verbal Medium instructions: Motivation Assessment Anxiety Level: Calm Cooperation: Cooperative Education Importance: Acknowledges Need Interest in Health Problems: Asks Questions Perception: Coherent Willingness to Engage in Self- Medium Management Activities: Readiness to Engage in Self- Medium Management Activities: Electronic Signature(s) Signed: 09/19/2017 9:43:55 AM By: Curtis Sites Entered By: Curtis Sites on 09/19/2017 09:43:54 [name]  ([id number]) -------------------------------------------------------------------------------- Fall Risk Assessment Details Patient Name: [name] Date of Service: 09/19/2017 9:45 AM Medical Record Number: [id number] Patient Account Number: [id number] Date of Birth/Sex: 1928/09/02 (84 y.o. Female) Treating RN: Curtis Sites Primary Care Tayari Yankee: Lois Huxley Other Clinician: Referring Darrall Strey: Daiva Eves, CORNELIUS Treating Ania Levay/Extender: Kathreen Cosier in Treatment: 0 Fall Risk Assessment Items Have you had 2 or more falls in the last 12 monthso 0 No Have you had any fall that resulted in injury in the last 12 monthso 0 No FALL RISK ASSESSMENT: History of falling - immediate or within 3 months 25 Yes Secondary diagnosis 0 No  Ambulatory aid None/bed rest/wheelchair/nurse 0 Yes Crutches/cane/walker 0 No Furniture 0 No IV Access/Saline Lock 0 No Gait/Training Normal/bed rest/immobile 0 No Weak 10 Yes Impaired 0 No Mental Status Oriented to own ability 0 Yes Electronic Signature(s) Signed: 09/19/2017 3:55:43 PM By: Curtis Sites Entered By: Curtis Sites on 09/19/2017 09:58:13 [name] ([id number]) -------------------------------------------------------------------------------- Nutrition Risk Assessment Details Patient Name: [name]. Date of Service: 09/19/2017 9:45 AM Medical Record Number: [id number] Patient Account Number: [id number] Date of Birth/Sex: Jan 15, 1929 (84 y.o. Female) Treating RN: Curtis Sites Primary Care Demontray Franta: Lois Huxley Other Clinician: Referring Corianne Buccellato: Daiva Eves, CORNELIUS Treating Aiesha Leland/Extender: Kathreen Cosier in Treatment: 0 Height (in): Weight (lbs): Body Mass Index (BMI): Nutrition Risk Assessment Items NUTRITION RISK SCREEN: I have an illness or condition that made me change the kind and/or amount of 0 No food I eat I eat fewer than two meals per day 0 No I eat few fruits and vegetables, or milk  products 0 No I have three or more drinks of beer, liquor or wine almost every day 0 No I have tooth or mouth problems that make it hard for me to eat 0 No I don't always have enough money to buy the food I need 0 No I eat alone most of the time 0 No I take three or more different prescribed or over-the-counter drugs a day 1 Yes Without wanting to, I have lost or gained 10 pounds in the last six months 0 No I am not always physically able to shop, cook and/or feed myself 0 No Nutrition Protocols Good Risk Protocol 0 No interventions needed Moderate Risk Protocol Electronic Signature(s) Signed: 09/19/2017 9:44:16 AM By: Curtis Sites Entered By: Curtis Sites on 09/19/2017 09:44:15

## 2017-09-20 NOTE — Progress Notes (Signed)
[name] ([id number]) Visit Report for 09/19/2017 Allergy List Details Patient Name: [name], [name]. Date of Service: 09/19/2017 9:45 AM Medical Record Number: [id number] Patient Account Number: [id number] Date of Birth/Sex: 01-26-1929 (84 y.o. Female) Treating RN: Curtis Sites Primary Care Ciela Mahajan: Lois Huxley Other Clinician: Referring Brittanyann Wittner: Daiva Eves, CORNELIUS Treating Jonty Morrical/Extender: Bonnell Public Weeks in Treatment: 0 Allergies Active Allergies Aspercreme ACE Inhibitors lidocaine HCl Allergy Notes Electronic Signature(s) Signed: 09/19/2017 3:55:43 PM By: Curtis Sites Previous Signature: 09/19/2017 9:41:41 AM Version By: Curtis Sites Entered By: Curtis Sites on 09/19/2017 09:55:33 Coco, [name] ([id number]) -------------------------------------------------------------------------------- Arrival Information Details Patient Name: [name], [name]. Date of Service: 09/19/2017 9:45 AM Medical Record Number: [id number] Patient Account Number: [id number] Date of Birth/Sex: Dec 05, 1928 (84 y.o. Female) Treating RN: Curtis Sites Primary Care Danett Palazzo: Lois Huxley Other Clinician: Referring Kaela Beitz: Daiva Eves, CORNELIUS Treating Lilliauna Van/Extender: Kathreen Cosier in Treatment: 0 Visit Information Patient Arrived: Wheel Chair Arrival Time: 09:51 Accompanied By: staff Transfer Assistance: Michiel Sites Lift Patient Identification Verified: Yes Secondary Verification Process Completed: Yes Patient Has Alerts: Yes Patient Alerts: DMII NO LIDOCAINE Electronic Signature(s) Signed: 09/19/2017 11:39:35 AM By: Alejandro Mulling Entered By: Alejandro Mulling on 09/19/2017 11:39:35 Wheless, [name] ([id number]) -------------------------------------------------------------------------------- Clinic Level of Care Assessment Details Patient Name: [name]. Date of Service: 09/19/2017 9:45 AM Medical Record Number: [id number] Patient Account Number: [id number] Date of  Birth/Sex: Dec 01, 1928 (84 y.o. Female) Treating RN: Ashok Cordia, Debi Primary Care Bueford Arp: Lois Huxley Other Clinician: Referring Lania Zawistowski: Daiva Eves, CORNELIUS Treating Osten Janek/Extender: Kathreen Cosier in Treatment: 0 Clinic Level of Care Assessment Items TOOL 1 Quantity Score X - Use when EandM and Procedure is performed on INITIAL visit 1 0 ASSESSMENTS - Nursing Assessment / Reassessment X - General Physical Exam (combine w/ comprehensive assessment (listed just below) when 1 20 performed on new pt. evals) X- 1 25 Comprehensive Assessment (HX, ROS, Risk Assessments, Wounds Hx, etc.) ASSESSMENTS - Wound and Skin Assessment / Reassessment []  - Dermatologic / Skin Assessment (not related to wound area) 0 ASSESSMENTS - Ostomy and/or Continence Assessment and Care []  - Incontinence Assessment and Management 0 []  - 0 Ostomy Care Assessment and Management (repouching, etc.) PROCESS - Coordination of Care []  - Simple Patient / Family Education for ongoing care 0 X- 1 20 Complex (extensive) Patient / Family Education for ongoing care X- 1 10 Staff obtains , Records, Test Results / Process Orders X- 1 10 Staff telephones HHA, Nursing Homes / Clarify orders / etc []  - 0 Routine Transfer to another Facility (non-emergent condition) []  - 0 Routine Hospital Admission (non-emergent condition) X- 1 15 New Admissions / / Ordering NPWT, Apligraf, etc. []  - 0 Emergency Hospital Admission (emergent condition) PROCESS - Special Needs []  - Pediatric / Minor Patient Management 0 []  - 0 Isolation Patient Management []  - 0 Hearing / Language / Visual special needs []  - 0 Assessment of Community assistance (transportation, D/C planning, etc.) []  - 0 Additional assistance / Altered mentation []  - 0 Support Surface(s) Assessment (bed, cushion, seat, etc.) Schmiesing, Kaityln J. ( ) INTERVENTIONS - Miscellaneous []  - External ear exam 0 []  - 0 Patient  Transfer (multiple staff / / Similar devices) []  - 0 Simple Staple / Suture removal (25 or less) []  - 0 Complex Staple / Suture removal (26 or more) []  - 0 Hypo/Hyperglycemic Management (do not check if billed separately) []  - 0 Ankle / Brachial Index (ABI) - do not check if billed separately Has the patient  been seen at the hospital within the last three years: Yes Total Score: 100 Level Of Care: New/Established - Level 3 Electronic Signature(s) Unsigned Entered By: Alejandro Mulling on 09/19/2017 14:34:54 Signature(s): Date(s): [name] ([id number]) -------------------------------------------------------------------------------- Encounter Discharge Information Details Patient Name: [name], [name]. Date of Service: 09/19/2017 9:45 AM Medical Record Number: [id number] Patient Account Number: [id number] Date of Birth/Sex: 08-07-1928 (84 y.o. Female) Treating RN: Renne Crigler Primary Care Tiran Sauseda: Lois Huxley Other Clinician: Referring Karle Desrosier: Daiva Eves, CORNELIUS Treating Reneisha Stilley/Extender: Kathreen Cosier in Treatment: 0 Encounter Discharge Information Items Discharge Pain Level: 0 Discharge Condition: Stable Ambulatory Status: Wheelchair Discharge Destination: Nursing Home Transportation: Other Accompanied By: caregiver Schedule Follow-up Appointment: Yes Medication Reconciliation completed and No provided to Patient/Care Kishaun Erekson: Provided on Clinical Summary of Care: 09/19/2017 Form Type Recipient Paper Patient SM Electronic Signature(s) Signed: 09/20/2017 8:44:40 AM By: Gwenlyn Perking Entered By: Gwenlyn Perking on 09/19/2017 10:46:13 Kiesel, [name] ([id number]) -------------------------------------------------------------------------------- Multi Wound Chart Details Patient Name: [name] Date of Service: 09/19/2017 9:45 AM Medical Record Number: [id number] Patient Account Number: [id number] Date of Birth/Sex: 10-20-1928 (84 y.o.  Female) Treating RN: Ashok Cordia, Debi Primary Care Atlas Kuc: Lois Huxley Other Clinician: Referring Enrique Weiss: Daiva Eves, CORNELIUS Treating Malasia Torain/Extender: Kathreen Cosier in Treatment: 0 Vital Signs Height(in): Pulse(bpm): 97 Weight(lbs): Blood Pressure(mmHg): 121/60 Body Mass Index(BMI): Temperature(F): 97.9 Respiratory Rate 18 (breaths/min): Photos: [N/A:N/A] Wound Location: Sacrum N/A N/A Wounding Event: Pressure Injury N/A N/A Primary Etiology: Pressure Ulcer N/A N/A Comorbid History: Cataracts, Anemia, N/A N/A Hypertension, Type II Diabetes, Rheumatoid Arthritis, Osteoarthritis, Received Chemotherapy, Received Radiation Date Acquired: 07/08/2017 N/A N/A Weeks of Treatment: 0 N/A N/A Wound Status: Open N/A N/A Measurements L x W x D 3.8x4.5x1.4 N/A N/A (cm) Area (cm) : 13.43 N/A N/A Volume (cm) : 18.802 N/A N/A Position 1 (o'clock): 5 Maximum Distance 1 (cm): 1.6 Starting Position 1 2 (o'clock): Ending Position 1 4 (o'clock): Maximum Distance 1 (cm): 2.5 Tunneling: Yes N/A N/A Undermining: Yes N/A N/A Classification: Unstageable/Unclassified N/A N/A Exudate Amount: Large N/A N/A Exudate Type: Serous N/A N/A Exudate Color: amber N/A N/A Biever, [name] ([id number]) Foul Odor After Cleansing: Yes N/A N/A Odor Anticipated Due to No N/A N/A Product Use: Wound Margin: Flat and Intact N/A N/A Granulation Amount: Small (1-33%) N/A N/A Granulation Quality: Pink N/A N/A Necrotic Amount: Large (67-100%) N/A N/A Necrotic Tissue: Eschar, Adherent Slough N/A N/A Exposed Structures: Fat Layer (Subcutaneous N/A N/A Tissue) Exposed: Yes Fascia: No Tendon: No Muscle: No Joint: No Bone: No Epithelialization: None N/A N/A Debridement: Debridement ([id number]) N/A N/A Pre-procedure 10:22 N/A N/A Verification/Time Out Taken: Tissue Debrided: Fibrin/Slough, Exudates, N/A N/A Subcutaneous Level: Skin/Subcutaneous Tissue N/A N/A Debridement Area (sq cm):  17.1 N/A N/A Instrument: Blade, Forceps N/A N/A Bleeding: Minimum N/A N/A Hemostasis Achieved: Pressure N/A N/A Procedural Pain: 0 N/A N/A Post Procedural Pain: 0 N/A N/A Debridement Treatment Procedure was tolerated well N/A N/A Response: Post Debridement 3.8x4.5x1.5 N/A N/A Measurements L x W x D (cm) Post Debridement Volume: 20.145 N/A N/A (cm) Post Debridement Stage: Unstageable/Unclassified N/A N/A Periwound Skin Texture: Excoriation: No N/A N/A Induration: No Callus: No Crepitus: No Rash: No Scarring: No Periwound Skin Moisture: Maceration: No N/A N/A Dry/Scaly: No Periwound Skin Color: Atrophie Blanche: No N/A N/A Cyanosis: No Ecchymosis: No Erythema: No Hemosiderin Staining: No Mottled: No Pallor: No Rubor: No Temperature: No Abnormality N/A N/A Tenderness on Palpation: Yes N/A N/A Wound Preparation: Ulcer Cleansing: N/A N/A Rinsed/Irrigated with Saline Sease, Elayjah J. ([id number]) Topical  Anesthetic Applied: None Procedures Performed: Debridement N/A N/A Treatment Notes Wound #1 (Sacrum) 1. Cleansed with: Clean wound with Normal Saline 2. Anesthetic Topical Lidocaine 4% cream to wound bed prior to debridement 4. Dressing Applied: Other dressing (specify in notes) 5. Secondary Dressing Applied Bordered Foam Dressing Notes dakins soaked gauze and covered with border foam gauze Electronic Signature(s) Signed: 09/19/2017 12:43:32 PM By: Bonnell Public Entered By: Bonnell Public on 09/19/2017 12:43:32 [name] ([id number]) -------------------------------------------------------------------------------- Multi-Disciplinary Care Plan Details Patient Name: [name], [name]. Date of Service: 09/19/2017 9:45 AM Medical Record Number: [id number] Patient Account Number: [id number] Date of Birth/Sex: Oct 09, 1928 (84 y.o. Female) Treating RN: Ashok Cordia, Debi Primary Care Ijeoma Loor: Lois Huxley Other Clinician: Referring Deniese Oberry: Daiva Eves,  CORNELIUS Treating Naryah Clenney/Extender: Kathreen Cosier in Treatment: 0 Active Inactive ` Abuse / Safety / Falls / Self Care Management Nursing Diagnoses: Potential for falls Goals: Patient will not experience any injury related to falls Date Initiated: 09/19/2017 Target Resolution Date: 01/04/2018 Goal Status: Active Interventions: Assess Activities of Daily Living upon admission and as needed Assess fall risk on admission and as needed Assess: immobility, friction, shearing, incontinence upon admission and as needed Assess impairment of mobility on admission and as needed per policy Notes: ` Nutrition Nursing Diagnoses: Imbalanced nutrition Potential for alteratiion in Nutrition/Potential for imbalanced nutrition Goals: Patient/caregiver agrees to and verbalizes understanding of need to use nutritional supplements and/or vitamins as prescribed Date Initiated: 09/19/2017 Target Resolution Date: 01/04/2018 Goal Status: Active Interventions: Assess patient nutrition upon admission and as needed per policy Notes: ` Orientation to the Wound Care Program Nursing Diagnoses: Knowledge deficit related to the wound healing center program Goals: Patient/caregiver will verbalize understanding of the Wound Healing Center Program [place], [name] ([id number]) Date Initiated: 09/19/2017 Target Resolution Date: 10/05/2017 Goal Status: Active Interventions: Provide education on orientation to the wound center Notes: ` Pain, Acute or Chronic Nursing Diagnoses: Pain, acute or chronic: actual or potential Potential alteration in comfort, pain Goals: Patient/caregiver will verbalize adequate pain control between visits Date Initiated: 09/19/2017 Target Resolution Date: 01/04/2018 Goal Status: Active Interventions: Complete pain assessment as per visit requirements Encourage patient to take pain medications as prescribed Notes: ` Wound/Skin Impairment Nursing Diagnoses: Impaired tissue  integrity Knowledge deficit related to ulceration/compromised skin integrity Goals: Ulcer/skin breakdown will have a volume reduction of 80% by week 12 Date Initiated: 09/19/2017 Target Resolution Date: 01/04/2018 Goal Status: Active Interventions: Assess patient/caregiver ability to perform ulcer/skin care regimen upon admission and as needed Assess ulceration(s) every visit Notes: Electronic Signature(s) Unsigned Entered By: Alejandro Mulling on 09/19/2017 10:19:57 Signature(s): Date(s): [name] ([id number]) -------------------------------------------------------------------------------- Pain Assessment Details Patient Name: [name], [name]. Date of Service: 09/19/2017 9:45 AM Medical Record Number: [id number] Patient Account Number: [id number] Date of Birth/Sex: 1928-07-15 (84 y.o. Female) Treating RN: Curtis Sites Primary Care Ryler Laskowski: Lois Huxley Other Clinician: Referring Rithik Odea: Daiva Eves, CORNELIUS Treating Briggette Najarian/Extender: Kathreen Cosier in Treatment: 0 Active Problems Location of Pain Severity and Description of Pain Patient Has Paino Yes Site Locations Pain Location: Pain in Ulcers With Dressing Change: Yes Character of Pain Describe the Pain: Burning Pain Management and Medication Current Pain Management: Electronic Signature(s) Signed: 09/19/2017 3:55:43 PM By: Curtis Sites Entered By: Curtis Sites on 09/19/2017 09:52:15 [name] ([id number]) -------------------------------------------------------------------------------- Patient/Caregiver Education Details Patient Name: [name]. Date of Service: 09/19/2017 9:45 AM Medical Record Number: [id number] Patient Account Number: [id number] Date of Birth/Gender: [birth year] (84 y.o. Female) Treating RN: Renne Crigler Primary Care Physician: Lois Huxley Other  Clinician: Referring Physician: Daiva Eves, CORNELIUS Treating Physician/Extender: Kathreen Cosier in Treatment:  0 Education Assessment Education Provided To: Patient ordwers sent with patient Education Topics Provided Welcome To The Wound Care Center: Methods: Explain/Verbal Responses: State content correctly Wound Debridement: Handouts: Wound Debridement Methods: Explain/Verbal Responses: State content correctly Wound/Skin Impairment: Handouts: Caring for Your Ulcer Responses: State content correctly Electronic Signature(s) Signed: 09/19/2017 4:30:34 PM By: Renne Crigler Entered By: Renne Crigler on 09/19/2017 10:46:05 [name], [name] ([id number]) -------------------------------------------------------------------------------- Wound Assessment Details Patient Name: [name]. Date of Service: 09/19/2017 9:45 AM Medical Record Number: [id number] Patient Account Number: [id number] Date of Birth/Sex: [birth year] (84 y.o. Female) Treating RN: Curtis Sites Primary Care Ramadan Couey: Lois Huxley Other Clinician: Referring Claressa Hughley: Daiva Eves, CORNELIUS Treating Neziah Braley/Extender: Kathreen Cosier in Treatment: 0 Wound Status Wound Number: 1 Primary Pressure Ulcer Etiology: Wound Location: Sacrum Wound Open Wounding Event: Pressure Injury Status: Date Acquired: 07/08/2017 Comorbid Cataracts, Anemia, Hypertension, Type II Weeks Of Treatment: 0 History: Diabetes, Rheumatoid Arthritis, Osteoarthritis, Clustered Wound: No Received Chemotherapy, Received Radiation Photos Photo Uploaded By: Curtis Sites on 09/19/2017 11:15:54 Wound Measurements Length: (cm) 3.8 % Redu Width: (cm) 4.5 % Redu Depth: (cm) 1.4 Epithe Area: (cm) 13.43 Tunne Volume: (cm) 18.802 Po Max ction in Area: ction in Volume: lialization: None ling: Yes sition (o'clock): 5 imum Distance: (cm) 1.6 Undermining: Yes Starting Position (o'clock): 2 Ending Position (o'clock): 4 Maximum Distance: (cm) 2.5 Wound Description Classification: Unstageable/Unclassified Wound Margin: Flat and Intact Exudate  Amount: Large Exudate Type: Serous Exudate Color: amber Foul Odor After Cleansing: Yes Due to Product Use: No Slough/Fibrino No Wound Bed Granulation Amount: Small (1-33%) Exposed Structure Granulation Quality: Pink Fascia Exposed: No S[place], Ryliee J. ([id number]) Necrotic Amount: Large (67-100%) Fat Layer (Subcutaneous Tissue) Exposed: Yes Necrotic Quality: Eschar, Adherent Slough Tendon Exposed: No Muscle Exposed: No Joint Exposed: No Bone Exposed: No Periwound Skin Texture Texture Color No Abnormalities Noted: No No Abnormalities Noted: No Callus: No Atrophie Blanche: No Crepitus: No Cyanosis: No Excoriation: No Ecchymosis: No Induration: No Erythema: No Rash: No Hemosiderin Staining: No Scarring: No Mottled: No Pallor: No Moisture Rubor: No No Abnormalities Noted: No Dry / Scaly: No Temperature / Pain Maceration: No Temperature: No Abnormality Tenderness on Palpation: Yes Wound Preparation Ulcer Cleansing: Rinsed/Irrigated with Saline Topical Anesthetic Applied: None Treatment Notes Wound #1 (Sacrum) 1. Cleansed with: Clean wound with Normal Saline 2. Anesthetic Topical Lidocaine 4% cream to wound bed prior to debridement 4. Dressing Applied: Other dressing (specify in notes) 5. Secondary Dressing Applied Bordered Foam Dressing Notes dakins soaked gauze and covered with border foam gauze Electronic Signature(s) Signed: 09/19/2017 3:55:43 PM By: Curtis Sites Entered By: Curtis Sites on 09/19/2017 10:11:16 [name] ([id number]) -------------------------------------------------------------------------------- Vitals Details Patient Name: [name]. Date of Service: 09/19/2017 9:45 AM Medical Record Number: [id number] Patient Account Number: [id number] Date of Birth/Sex: 13-Mar-1929 (84 y.o. Female) Treating RN: Curtis Sites Primary Care Kerryn Tennant: Lois Huxley Other Clinician: Referring Erskin Zinda: Daiva Eves, CORNELIUS Treating  Alando Colleran/Extender: Kathreen Cosier in Treatment: 0 Vital Signs Time Taken: 09:52 Temperature (F): 97.9 Pulse (bpm): 97 Respiratory Rate (breaths/min): 18 Blood Pressure (mmHg): 121/60 Reference Range: 80 - 120 mg / dl Electronic Signature(s) Signed: 09/19/2017 3:55:43 PM By: Curtis Sites Entered By: Curtis Sites on 09/19/2017 09:54:38

## 2017-09-26 ENCOUNTER — Encounter: Payer: Medicare Other | Admitting: Nurse Practitioner

## 2017-09-26 DIAGNOSIS — E11622 Type 2 diabetes mellitus with other skin ulcer: Secondary | ICD-10-CM | POA: Diagnosis not present

## 2017-09-28 NOTE — Progress Notes (Signed)
RYLIN, OHLMANN ([id number]) Visit Report for 09/26/2017 Arrival Information Details Patient Name: [name], [name]. Date of Service: 09/26/2017 1:15 PM Medical Record Number: [id number] Patient Account Number: [id number] Date of Birth/Sex: 08-Oct-1928 (84 y.o. Female) Treating RN: Curtis Sites Primary Care Calianne Larue: Lois Huxley Other Clinician: Referring Lamberto Dinapoli: Lois Huxley Treating Joanne Brander/Extender: Kathreen Cosier in Treatment: 1 Visit Information History Since Last Visit Added or deleted any medications: No Patient Arrived: Wheel Chair Any new allergies or adverse reactions: No Arrival Time: 13:22 Had a fall or experienced change in No Accompanied By: staff activities of daily living that may affect Transfer Assistance: Michiel Sites Lift risk of falls: Patient Identification Verified: Yes Signs or symptoms of abuse/neglect since last visito No Secondary Verification Process Completed: Yes Hospitalized since last visit: No Patient Has Alerts: Yes Implantable device outside of the clinic excluding No Patient Alerts: DMII cellular tissue based products placed in the center NO LIDOCAINE since last visit: Has Dressing in Place as Prescribed: Yes Pain Present Now: No Electronic Signature(s) Signed: 09/26/2017 4:03:23 PM By: Curtis Sites Entered By: Curtis Sites on 09/26/2017 13:23:03 [name] ([id number]) -------------------------------------------------------------------------------- Encounter Discharge Information Details Patient Name: [name]. Date of Service: 09/26/2017 1:15 PM Medical Record Number: [id number] Patient Account Number: [id number] Date of Birth/Sex: 06-24-1929 (84 y.o. Female) Treating RN: Ashok Cordia, Debi Primary Care Berel Najjar: Lois Huxley Other Clinician: Referring Livan Hires: Lois Huxley Treating Caree Wolpert/Extender: Kathreen Cosier in Treatment: 1 Encounter Discharge Information Items Discharge Pain Level: 0 Discharge Condition:  Stable Ambulatory Status: Wheelchair Discharge Destination: Nursing Home Transportation: Private Auto Accompanied By: driver Schedule Follow-up Appointment: Yes Medication Reconciliation completed and Yes provided to Patient/Care Drayton Tieu: Provided on Clinical Summary of Care: 09/26/2017 Form Type Recipient Paper Patient SM Electronic Signature(s) Signed: 09/27/2017 1:03:41 PM By: Elliot Gurney, BSN, RN, CWS, Kim RN, BSN Entered By: Elliot Gurney, BSN, RN, CWS, Kim on 09/26/2017 14:02:12 [name] ([id number]) -------------------------------------------------------------------------------- Lower Extremity Assessment Details Patient Name: [name]. Date of Service: 09/26/2017 1:15 PM Medical Record Number: [id number] Patient Account Number: [id number] Date of Birth/Sex: Jan 02, 1929 (84 y.o. Female) Treating RN: Curtis Sites Primary Care Vicente Weidler: Lois Huxley Other Clinician: Referring Beauford Lando: Lois Huxley Treating Niguel Moure/Extender: Bonnell Public Weeks in Treatment: 1 Electronic Signature(s) Signed: 09/26/2017 4:03:23 PM By: Curtis Sites Entered By: Curtis Sites on 09/26/2017 13:35:57 Elenes, [name] ([id number]) -------------------------------------------------------------------------------- Multi Wound Chart Details Patient Name: [name] Date of Service: 09/26/2017 1:15 PM Medical Record Number: [id number] Patient Account Number: [id number] Date of Birth/Sex: 12-26-1928 (84 y.o. Female) Treating RN: Ashok Cordia, Debi Primary Care Ronda Rajkumar: Lois Huxley Other Clinician: Referring Jkai Arwood: Lois Huxley Treating Aarin Bluett/Extender: Kathreen Cosier in Treatment: 1 Vital Signs Height(in): Pulse(bpm): 90 Weight(lbs): Blood Pressure(mmHg): 136/67 Body Mass Index(BMI): Temperature(F): 98.4 Respiratory Rate 18 (breaths/min): Photos: [1:No Photos] [N/A:N/A] Wound Location: [1:Sacrum] [N/A:N/A] Wounding Event: [1:Pressure Injury] [N/A:N/A] Primary Etiology:  [1:Pressure Ulcer] [N/A:N/A] Comorbid History: [1:Cataracts, Anemia, Hypertension, Type II Diabetes, Rheumatoid Arthritis, Osteoarthritis, Received Chemotherapy, Received Radiation] [N/A:N/A] Date Acquired: [1:07/08/2017] [N/A:N/A] Weeks of Treatment: [1:1] [N/A:N/A] Wound Status: [1:Open] [N/A:N/A] Measurements L x W x D [1:4.5x3.5x1.4] [N/A:N/A] (cm) Area (cm) : [1:12.37] [N/A:N/A] Volume (cm) : [1:17.318] [N/A:N/A] % Reduction in Area: [1:7.90%] [N/A:N/A] % Reduction in Volume: [1:7.90%] [N/A:N/A] Classification: [1:Unstageable/Unclassified] [N/A:N/A] Exudate Amount: [1:Large] [N/A:N/A] Exudate Type: [1:Purulent] [N/A:N/A] Exudate Color: [1:yellow, brown, green] [N/A:N/A] Foul Odor After Cleansing: [1:Yes] [N/A:N/A] Odor Anticipated Due to [1:No] [N/A:N/A] Product Use: Wound Margin: [1:Flat and Intact] [N/A:N/A] Granulation Amount: [1:Small (1-33%)] [N/A:N/A] Granulation Quality: [1:Pink] [N/A:N/A] Necrotic Amount: [1:Large (  67-100%)] [N/A:N/A] Necrotic Tissue: [1:Eschar, Adherent Slough] [N/A:N/A] Exposed Structures: [1:Fat Layer (Subcutaneous Tissue) Exposed: Yes Fascia: No Tendon: No Muscle: No] [N/A:N/A] Joint: No Bone: No Epithelialization: None N/A N/A Debridement: Debridement - Excisional N/A N/A Pre-procedure 13:42 N/A N/A Verification/Time Out Taken: Pain Control: Lidocaine 4% Topical Solution N/A N/A Tissue Debrided: Subcutaneous, Slough N/A N/A Level: Skin/Subcutaneous Tissue N/A N/A Debridement Area (sq cm): 15.75 N/A N/A Instrument: Curette N/A N/A Bleeding: Minimum N/A N/A Hemostasis Achieved: Pressure N/A N/A Procedural Pain: 0 N/A N/A Post Procedural Pain: 0 N/A N/A Debridement Treatment Procedure was tolerated well N/A N/A Response: Post Debridement 4.5x3.5x1.5 N/A N/A Measurements L x W x D (cm) Post Debridement Volume: 18.555 N/A N/A (cm) Post Debridement Stage: Unstageable/Unclassified N/A N/A Periwound Skin Texture: Excoriation: No N/A  N/A Induration: No Callus: No Crepitus: No Rash: No Scarring: No Periwound Skin Moisture: Maceration: No N/A N/A Dry/Scaly: No Periwound Skin Color: Atrophie Blanche: No N/A N/A Cyanosis: No Ecchymosis: No Erythema: No Hemosiderin Staining: No Mottled: No Pallor: No Rubor: No Temperature: No Abnormality N/A N/A Tenderness on Palpation: Yes N/A N/A Wound Preparation: Ulcer Cleansing: N/A N/A Rinsed/Irrigated with Saline Topical Anesthetic Applied: None Procedures Performed: Debridement N/A N/A Treatment Notes Electronic Signature(s) Signed: 09/26/2017 1:57:10 PM By: Bonnell Public Entered By: Bonnell Public on 09/26/2017 13:57:10 [name] ([id number]) -------------------------------------------------------------------------------- Multi-Disciplinary Care Plan Details Patient Name: CAYCEE, WANAT. Date of Service: 09/26/2017 1:15 PM Medical Record Number: [id number] Patient Account Number: [id number] Date of Birth/Sex: June 06, 1929 (84 y.o. Female) Treating RN: Ashok Cordia, Debi Primary Care Aleena Kirkeby: Lois Huxley Other Clinician: Referring Negar Sieler: Lois Huxley Treating Sahej Schrieber/Extender: Kathreen Cosier in Treatment: 1 Active Inactive ` Abuse / Safety / Falls / Self Care Management Nursing Diagnoses: Potential for falls Goals: Patient will not experience any injury related to falls Date Initiated: 09/19/2017 Target Resolution Date: 01/04/2018 Goal Status: Active Interventions: Assess Activities of Daily Living upon admission and as needed Assess fall risk on admission and as needed Assess: immobility, friction, shearing, incontinence upon admission and as needed Assess impairment of mobility on admission and as needed per policy Notes: ` Nutrition Nursing Diagnoses: Imbalanced nutrition Potential for alteratiion in Nutrition/Potential for imbalanced nutrition Goals: Patient/caregiver agrees to and verbalizes understanding of need to use nutritional  supplements and/or vitamins as prescribed Date Initiated: 09/19/2017 Target Resolution Date: 01/04/2018 Goal Status: Active Interventions: Assess patient nutrition upon admission and as needed per policy Notes: ` Orientation to the Wound Care Program Nursing Diagnoses: Knowledge deficit related to the wound healing center program Goals: Patient/caregiver will verbalize understanding of the Wound Healing Center Program Dix, JANEESE MCGLOIN ([id number]) Date Initiated: 09/19/2017 Target Resolution Date: 10/05/2017 Goal Status: Active Interventions: Provide education on orientation to the wound center Notes: ` Pain, Acute or Chronic Nursing Diagnoses: Pain, acute or chronic: actual or potential Potential alteration in comfort, pain Goals: Patient/caregiver will verbalize adequate pain control between visits Date Initiated: 09/19/2017 Target Resolution Date: 01/04/2018 Goal Status: Active Interventions: Complete pain assessment as per visit requirements Encourage patient to take pain medications as prescribed Notes: ` Wound/Skin Impairment Nursing Diagnoses: Impaired tissue integrity Knowledge deficit related to ulceration/compromised skin integrity Goals: Ulcer/skin breakdown will have a volume reduction of 80% by week 12 Date Initiated: 09/19/2017 Target Resolution Date: 01/04/2018 Goal Status: Active Interventions: Assess patient/caregiver ability to perform ulcer/skin care regimen upon admission and as needed Assess ulceration(s) every visit Notes: Electronic Signature(s) Signed: 09/26/2017 4:15:03 PM By: Alejandro Mulling Entered By: Alejandro Mulling on 09/26/2017 13:40:33 Ivie, [name] ([id number]) --------------------------------------------------------------------------------  Pain Assessment Details Patient Name: CELLIA, REIK. Date of Service: 09/26/2017 1:15 PM Medical Record Number: [id number] Patient Account Number: [id number] Date of Birth/Sex: [birth year] (84 y.o.  Female) Treating RN: Curtis Sites Primary Care Belvin Gauss: Lois Huxley Other Clinician: Referring Juana Montini: Lois Huxley Treating Kerri-Anne Haeberle/Extender: Kathreen Cosier in Treatment: 1 Active Problems Location of Pain Severity and Description of Pain Patient Has Paino No Site Locations Pain Management and Medication Current Pain Management: Electronic Signature(s) Signed: 09/26/2017 4:03:23 PM By: Curtis Sites Entered By: Curtis Sites on 09/26/2017 13:23:16 [name] ([id number]) -------------------------------------------------------------------------------- Patient/Caregiver Education Details Patient Name: MARCINE, MOOREFIELD. Date of Service: 09/26/2017 1:15 PM Medical Record Number: [id number] Patient Account Number: [id number] Date of Birth/Gender: 1928/07/23 (84 y.o. Female) Treating RN: Huel Coventry Primary Care Physician: Lois Huxley Other Clinician: Referring Physician: Lois Huxley Treating Physician/Extender: Kathreen Cosier in Treatment: 1 Education Assessment Education Provided To: Patient Education Topics Provided Wound/Skin Impairment: Handouts: Caring for Your Ulcer Methods: Demonstration, Explain/Verbal Responses: State content correctly Electronic Signature(s) Signed: 09/27/2017 1:03:41 PM By: Elliot Gurney, BSN, RN, CWS, Kim RN, BSN Entered By: Elliot Gurney, BSN, RN, CWS, Kim on 09/26/2017 14:02:30 [name] ([id number]) -------------------------------------------------------------------------------- Wound Assessment Details Patient Name: CHRISTANY, BELLER. Date of Service: 09/26/2017 1:15 PM Medical Record Number: [id number] Patient Account Number: [id number] Date of Birth/Sex: 1929-01-16 (84 y.o. Female) Treating RN: Curtis Sites Primary Care Leshawn Straka: Lois Huxley Other Clinician: Referring Deshawn Skelley: Lois Huxley Treating Shila Kruczek/Extender: Kathreen Cosier in Treatment: 1 Wound Status Wound Number: 1 Primary Pressure Ulcer Etiology: Wound  Location: Sacrum Wound Open Wounding Event: Pressure Injury Status: Date Acquired: 07/08/2017 Comorbid Cataracts, Anemia, Hypertension, Type II Weeks Of Treatment: 1 History: Diabetes, Rheumatoid Arthritis, Osteoarthritis, Clustered Wound: No Received Chemotherapy, Received Radiation Photos Photo Uploaded By: Curtis Sites on 09/26/2017 14:23:01 Wound Measurements Length: (cm) 4.5 Width: (cm) 3.5 Depth: (cm) 1.4 Area: (cm) 12.37 Volume: (cm) 17.318 % Reduction in Area: 7.9% % Reduction in Volume: 7.9% Epithelialization: None Tunneling: No Undermining: No Wound Description Classification: Category/Stage IV Foul O Wound Margin: Flat and Intact Due to Exudate Amount: Large Slough Exudate Type: Purulent Exudate Color: yellow, brown, green dor After Cleansing: Yes Product Use: No /Fibrino No Wound Bed Granulation Amount: Small (1-33%) Exposed Structure Granulation Quality: Pink Fascia Exposed: No Necrotic Amount: Large (67-100%) Fat Layer (Subcutaneous Tissue) Exposed: Yes Necrotic Quality: Eschar, Adherent Slough Tendon Exposed: No Muscle Exposed: No Joint Exposed: No Bone Exposed: Yes Periwound Skin Texture Berringer, Tanajah J. ([id number]) Texture Color No Abnormalities Noted: No No Abnormalities Noted: No Callus: No Atrophie Blanche: No Crepitus: No Cyanosis: No Excoriation: No Ecchymosis: No Induration: No Erythema: No Rash: No Hemosiderin Staining: No Scarring: No Mottled: No Pallor: No Moisture Rubor: No No Abnormalities Noted: No Dry / Scaly: No Temperature / Pain Maceration: No Temperature: No Abnormality Tenderness on Palpation: Yes Wound Preparation Ulcer Cleansing: Rinsed/Irrigated with Saline Topical Anesthetic Applied: None Treatment Notes Wound #1 (Sacrum) 1. Cleansed with: Clean wound with Normal Saline 3. Peri-wound Care: Skin Prep 4. Dressing Applied: Other dressing (specify in notes) 5. Secondary Dressing Applied Bordered  Foam Dressing Notes dakins soaked gauze and covered with border foam gauze Electronic Signature(s) Signed: 09/26/2017 2:13:28 PM By: Curtis Sites Entered By: Curtis Sites on 09/26/2017 14:13:28 Lacombe, [name] ([id number]) -------------------------------------------------------------------------------- Vitals Details Patient Name: [name]. Date of Service: 09/26/2017 1:15 PM Medical Record Number: [id number] Patient Account Number: [id number] Date of Birth/Sex: 11-19-1928 (84 y.o. Female) Treating RN: Curtis Sites Primary Care Hiroko Tregre: Lois Huxley Other Clinician: Referring  Bertis Hustead: Lois Huxley Treating Tanja Gift/Extender: Bonnell Public Weeks in Treatment: 1 Vital Signs Time Taken: 13:23 Temperature (F): 98.4 Pulse (bpm): 90 Respiratory Rate (breaths/min): 18 Blood Pressure (mmHg): 136/67 Reference Range: 80 - 120 mg / dl Electronic Signature(s) Signed: 09/26/2017 4:03:23 PM By: Curtis Sites Entered By: Curtis Sites on 09/26/2017 13:25:38

## 2017-09-28 NOTE — Progress Notes (Addendum)
[name], [name] ([id number]) Visit Report for 09/26/2017 Chief Complaint Document Details Patient Name: [name], [name]. Date of Service: 09/26/2017 1:15 PM Medical Record Number: [id number] Patient Account Number: [id number] Date of Birth/Sex: [birth year] (84 y.o. Female) Treating RN: Phillis Haggis Primary Care Provider: Lois Huxley Other Clinician: Referring Provider: Lois Huxley Treating Provider/Extender: Kathreen Cosier in Treatment: 1 Information Obtained from: Patient Chief Complaint She is here for evaluation of a sacrococcygeal pressure ulcer Electronic Signature(s) Signed: 09/26/2017 2:05:48 PM By: Bonnell Public Entered By: Bonnell Public on 09/26/2017 14:05:47 [name] ([id number]) -------------------------------------------------------------------------------- Debridement Details Patient Name: [name]. Date of Service: 09/26/2017 1:15 PM Medical Record Number: [id number] Patient Account Number: [id number] Date of Birth/Sex: 02-15-1929 (84 y.o. Female) Treating RN: Ashok Cordia, Debi Primary Care Provider: Lois Huxley Other Clinician: Referring Provider: Lois Huxley Treating Provider/Extender: Kathreen Cosier in Treatment: 1 Debridement Performed for Wound #1 Sacrum Assessment: Performed By: Physician Bonnell Public, NP Debridement Type: Debridement Pre-procedure Verification/Time Yes - 13:42 Out Taken: Start Time: 13:43 Total Area Debrided (L x W): 4.5 (cm) x 3.5 (cm) = 15.75 (cm) Tissue and other material Viable, Non-Viable, Slough, Subcutaneous, Fibrin/Exudate, Slough debrided: Level: Skin/Subcutaneous Tissue Debridement Description: Excisional Instrument: Curette Bleeding: Minimum Hemostasis Achieved: Pressure End Time: 13:44 Procedural Pain: 0 Post Procedural Pain: 0 Response to Treatment: Procedure was tolerated well Post Debridement Measurements of Total Wound Length: (cm) 4.5 Stage: Category/Stage IV Width: (cm) 3.5 Depth:  (cm) 1.5 Volume: (cm) 18.555 Character of Wound/Ulcer Post Requires Further Debridement Debridement: Post Procedure Diagnosis Same as Pre-procedure Notes documented allergy to lidocaine Electronic Signature(s) Signed: 09/26/2017 1:57:42 PM By: Bonnell Public Signed: 09/26/2017 4:15:03 PM By: Alejandro Mulling Entered By: Bonnell Public on 09/26/2017 13:57:42 Samons, [name] ([id number]) -------------------------------------------------------------------------------- HPI Details Patient Name: [name]. Date of Service: 09/26/2017 1:15 PM Medical Record Number: [id number] Patient Account Number: [id number] Date of Birth/Sex: June 30, 1929 (84 y.o. Female) Treating RN: Phillis Haggis Primary Care Provider: Lois Huxley Other Clinician: Referring Provider: Lois Huxley Treating Provider/Extender: Kathreen Cosier in Treatment: 1 History of Present Illness HPI Description: 09/19/17-She is here for initial evaluation of the sacrococcygeal pressure ulcer. She states she was hospitalized for 1 month December-January in [place]; she was out of town for her brother's funeral at the time. While hospitalized she developed a sacral ulcer and was discharged to [facility] and [place]. She is nonambulatory, Hoyer lift dependent. She states she is repositioned while in bed but spends approximately 5-8 hours a day in the wheelchair. She does admit to pain with sitting. She denies being on any antibiotic therapy or any specific evaluation for her sacral ulcer since arriving to rehabilitation. She has a listed allergy to lidocaine, but states this was a combination medication of Aspercreme with lidocaine that she had a reaction to. She tolerated debridement of nonviable tissue today, there is no overt sign of infection and I expect a swab culture would produce a polymicrobial result which may not reflect an acute infection. We will treat with Dakin's wet-to-dry daily and she will  follow-up next week. 09/26/17-She is here for evaluation for stage IV sacral pressure ulcer. Mobile x-ray was performed at her facility and the conclusion reads findings suspicious for osteomyelitis involving the inferior sacrum; we will initiate her on doxycycline and refer her to infectious disease for their input. She had a pre-albumin drawn, level 7; referral for facility dietitian to re-evaluate. There is improvement in amount of non-viable tissue, granulation tissue present; will continue with same  treatment plan and follow up next week. Electronic Signature(s) Signed: 09/26/2017 2:09:59 PM By: Bonnell Public Previous Signature: 09/26/2017 10:12:31 AM Version By: Bonnell Public Entered By: Bonnell Public on 09/26/2017 14:09:58 [name] ([id number]) -------------------------------------------------------------------------------- Physician Orders Details Patient Name: [name], [name] Date of Service: 09/26/2017 1:15 PM Medical Record Number: [id number] Patient Account Number: [id number] Date of Birth/Sex: 05-31-1929 (84 y.o. Female) Treating RN: Ashok Cordia, Debi Primary Care Provider: Lois Huxley Other Clinician: Referring Provider: Lois Huxley Treating Provider/Extender: Kathreen Cosier in Treatment: 1 Verbal / Phone Orders: Yes Clinician: Pinkerton, Debi Read Back and Verified: Yes Diagnosis Coding Wound Cleansing Wound #1 Sacrum o Clean wound with Normal Saline. Anesthetic (add to Medication List) Wound #1 Sacrum o Topical Lidocaine 4% cream applied to wound bed prior to debridement (In Clinic Only). Skin Barriers/Peri-Wound Care Wound #1 Sacrum o Skin Prep Primary Wound Dressing Wound #1 Sacrum o Other: - Dakin's Solution moisten gauze Secondary Dressing Wound #1 Sacrum o Dry Gauze o Boardered Foam Dressing Dressing Change Frequency Wound #1 Sacrum o Change dressing every day. Follow-up Appointments Wound #1 Sacrum o Return Appointment in 1  week. Off-Loading Wound #1 Sacrum o Mattress - Group II o Turn and reposition every 2 hours Additional Orders / Instructions Wound #1 Sacrum o Increase protein intake. o Other: - Vitamin C, Zinc Dietitian Evaluation for protein intake adjustments Consults Wagoner, RYN OGRADY ([id number]) o Infectious Disease Patient Medications Allergies: Aspercreme, ACE Inhibitors, lidocaine HCl Notifications Medication Indication Start End lidocaine DOSE 1 - topical 4 % cream - 1 cream topical doxycycline hyclate 09/26/2017 DOSE oral 100 mg tablet - tablet oral twice daily for 14 days Electronic Signature(s) Signed: 09/26/2017 2:14:38 PM By: Bonnell Public Entered By: Bonnell Public on 09/26/2017 14:14:38 [name] ([id number]) -------------------------------------------------------------------------------- Prescription 09/26/2017 Patient Name: [name] Provider: Bonnell Public NP Date of Birth: 07-11-1928 NPI#: [id number] Sex: Female DEA#: [id number] Phone #: [phone] License #: Patient Address: [facility] [address] [facility] [place], [place] [address] [address], [address] [place], [place] [address] [phone] Allergies Aspercreme ACE Inhibitors lidocaine HCl Medication Medication: Route: Strength: Form: lidocaine 4 % topical cream topical 4% cream Class: TOPICAL LOCAL ANESTHETICS Dose: Frequency / Time: Indication: 1 1 cream topical Number of Refills: Number of Units: 0 Generic Substitution: Start Date: End Date: One Time Use: Substitution Permitted No Note to Pharmacy: [facility]): Date(s): TORYN, GUNDER ([id number]) Prescription 09/26/2017 Patient Name: [name] Provider: Bonnell Public NP Date of Birth: March 06, 1929 NPI#: [id number] Sex: Female DEA#: [id number] Phone #: [phone] License #: Patient Address: [facility] [address] [facility] [place], [place] [address] [address], [address] [place], [place] [address] [phone] Allergies Aspercreme ACE Inhibitors lidocaine HCl Medication Medication: Route: Strength: Form: doxycycline hyclate oral 100 mg tablet Class: PERIODONTAL COLLAGENASE INHIBITORS Dose: Frequency / Time: Indication: tablet oral twice daily for 14 days Number of Refills: Number of Units: 0 Generic Substitution: Start Date: End Date: Administered at Substitution Permitted 09/26/2017 Facility: No Note to Pharmacy: [facility]): Date(s): Electronic Signature(s) MCKENLEIGH, CHILES ([id number]) Signed: 09/26/2017 9:02:48 PM By: Bonnell Public Entered By: Bonnell Public on 09/26/2017 14:14:38 [name] ([id number]) --------------------------------------------------------------------------------  Problem List Details Patient Name: [name]. Date of Service: 09/26/2017 1:15 PM Medical Record Number: [id number] Patient Account Number: [id number] Date of Birth/Sex: 22-May-1929 (84 y.o. Female) Treating RN: Phillis Haggis Primary Care Provider: DAVIS,  JAMES Other Clinician: Referring Provider: Lois Huxley Treating Provider/Extender: Kathreen Cosier in Treatment: 1 Active Problems ICD-10 Impacting Encounter Code Description Active Date Wound Healing Diagnosis L89.154 Pressure ulcer of sacral region, stage 4 09/19/2017 Yes Z99.3 Dependence on wheelchair 09/19/2017 Yes R54 Age-related physical debility 09/19/2017 Yes M46.28 Osteomyelitis of vertebra, sacral and sacrococcygeal region 09/26/2017 Yes E43 Unspecified severe protein-calorie malnutrition 09/26/2017 Yes Inactive Problems Resolved Problems Electronic Signature(s) Signed: 09/26/2017 2:12:25 PM By: Bonnell Public Previous Signature: 09/26/2017 1:57:04 PM Version By: Bonnell Public Entered By: Bonnell Public on 09/26/2017 14:12:24 [name]  ([id number]) -------------------------------------------------------------------------------- Progress Note Details Patient Name: [name]. Date of Service: 09/26/2017 1:15 PM Medical Record Number: [id number] Patient Account Number: [id number] Date of Birth/Sex: 1929-06-05 (84 y.o. Female) Treating RN: Phillis Haggis Primary Care Provider: Lois Huxley Other Clinician: Referring Provider: Lois Huxley Treating Provider/Extender: Kathreen Cosier in Treatment: 1 Subjective Chief Complaint Information obtained from Patient She is here for evaluation of a sacrococcygeal pressure ulcer History of Present Illness (HPI) 09/19/17-She is here for initial evaluation of the sacrococcygeal pressure ulcer. She states she was hospitalized for 1 month December-January in [place]; she was out of town for her brother's funeral at the time. While hospitalized she developed a sacral ulcer and was discharged to [facility] and [place]. She is nonambulatory, Hoyer lift dependent. She states she is repositioned while in bed but spends approximately 5-8 hours a day in the wheelchair. She does admit to pain with sitting. She denies being on any antibiotic therapy or any specific evaluation for her sacral ulcer since arriving to rehabilitation. She has a listed allergy to lidocaine, but states this was a combination medication of Aspercreme with lidocaine that she had a reaction to. She tolerated debridement of nonviable tissue today, there is no overt sign of infection and I expect a swab culture would produce a polymicrobial result which may not reflect an acute infection. We will treat with Dakin's wet-to-dry daily and she will follow-up next week. 09/26/17-She is here for evaluation for stage IV sacral pressure ulcer. Mobile x-ray was performed at her facility and the conclusion reads findings suspicious for osteomyelitis involving the inferior sacrum; we will initiate her on  doxycycline and refer her to infectious disease for their input. She had a pre-albumin drawn, level 7; referral for facility dietitian to re-evaluate. There is improvement in amount of non-viable tissue, granulation tissue present; will continue with same treatment plan and follow up next week. Patient History Information obtained from Patient. Family History Cancer - Child, No family history of Diabetes, Heart Disease, Hereditary Spherocytosis, Hypertension, Kidney Disease, Lung Disease, Seizures, Stroke, Thyroid Problems, Tuberculosis. Social History Never smoker, Marital Status - Widowed, Alcohol Use - Never, Drug Use - No History, Caffeine Use - Rarely. Medical And Surgical History Notes Cardiovascular hyperlipidemia Gastrointestinal hernia repair December 2018, colostomy Oncologic rectal cancer 20 years ago KATHLYNE, LOUD. ([id number]) Objective Constitutional Vitals Time Taken: 1:23 PM, Temperature: 98.4 F, Pulse: 90 bpm, Respiratory Rate: 18 breaths/min, Blood Pressure: 136/67 mmHg. Integumentary (Hair, Skin) Wound #1 status is Open. Original cause of wound was Pressure Injury. The wound is located on the Sacrum. The wound measures 4.5cm length x 3.5cm width x 1.4cm depth; 12.37cm^2 area and 17.318cm^3 volume. There is bone and Fat Layer (Subcutaneous Tissue) Exposed exposed. There is no tunneling or undermining noted. There is a large amount of purulent drainage noted. Foul odor after cleansing was noted. The wound margin is flat and intact. There is  small (1-33%) pink granulation within the wound bed. There is a large (67-100%) amount of necrotic tissue within the wound bed including Eschar and Adherent Slough. The periwound skin appearance did not exhibit: Callus, Crepitus, Excoriation, Induration, Rash, Scarring, Dry/Scaly, Maceration, Atrophie Blanche, Cyanosis, Ecchymosis, Hemosiderin Staining, Mottled, Pallor, Rubor, Erythema. Periwound temperature was noted as No  Abnormality. The periwound has tenderness on palpation. Assessment Active Problems ICD-10 L89.154 - Pressure ulcer of sacral region, stage 4 Z99.3 - Dependence on wheelchair R54 - Age-related physical debility M46.28 - Osteomyelitis of vertebra, sacral and sacrococcygeal region E43 - Unspecified severe protein-calorie malnutrition Procedures Wound #1 Pre-procedure diagnosis of Wound #1 is a Pressure Ulcer located on the Sacrum . There was a Excisional Skin/Subcutaneous Tissue Debridement with a total area of 15.75 sq cm performed by Bonnell Public, NP. With the following instrument(s): Curette. to remove Viable and Non-Viable tissue/material Material removed includes Subcutaneous Tissue, and Slough, Fibrin/Exudate, and [place]. No specimens were taken. A time out was conducted at 13:42, prior to the start of the procedure. A Minimum amount of bleeding was controlled with Pressure. The procedure was tolerated well with a pain level of 0 throughout and a pain level of 0 following the procedure. Post Debridement Measurements: 4.5cm length x 3.5cm width x 1.5cm depth; 18.555cm^3 volume. Post debridement Stage noted as Category/Stage IV. Character of Wound/Ulcer Post Debridement requires further debridement. Post procedure Diagnosis Wound #1: Same as Pre-Procedure General Notes: documented allergy to lidocaine. Quanah, Mckaylah JMarland Kitchen ([id number]) Plan Wound Cleansing: Wound #1 Sacrum: Clean wound with Normal Saline. Anesthetic (add to Medication List): Wound #1 Sacrum: Topical Lidocaine 4% cream applied to wound bed prior to debridement (In Clinic Only). Skin Barriers/Peri-Wound Care: Wound #1 Sacrum: Skin Prep Primary Wound Dressing: Wound #1 Sacrum: Other: - Dakin's Solution moisten gauze Secondary Dressing: Wound #1 Sacrum: Dry Gauze Boardered Foam Dressing Dressing Change Frequency: Wound #1 Sacrum: Change dressing every day. Follow-up Appointments: Wound #1 Sacrum: Return  Appointment in 1 week. Off-Loading: Wound #1 Sacrum: Mattress - Group II Turn and reposition every 2 hours Additional Orders / Instructions: Wound #1 Sacrum: Increase protein intake. Other: - Vitamin C, Zinc Dietitian Evaluation for protein intake adjustments Consults ordered were: Infectious Disease The following medication(s) was prescribed: lidocaine topical 4 % cream 1 1 cream topical was prescribed at facility doxycycline hyclate oral 100 mg tablet tablet oral twice daily for 14 days starting 09/26/2017 1. continue with dakins wet to dry 2. doxycycline 3. continue with aggressive offloading 4. facility dietician referral Electronic Signature(s) Signed: 09/26/2017 9:04:54 PM By: Bonnell Public Previous Signature: 09/26/2017 2:12:46 PM Version By: Bonnell Public Previous Signature: 09/26/2017 2:11:37 PM Version By: Bonnell Public Entered By: Bonnell Public on 09/26/2017 21:04:54 [name] ([id number]) -------------------------------------------------------------------------------- ROS/PFSH Details Patient Name: [name] Date of Service: 09/26/2017 1:15 PM Medical Record Number: [id number] Patient Account Number: [id number] Date of Birth/Sex: [birth year] (84 y.o. Female) Treating RN: Ashok Cordia, Debi Primary Care Provider: Lois Huxley Other Clinician: Referring Provider: Lois Huxley Treating Provider/Extender: Kathreen Cosier in Treatment: 1 Information Obtained From Patient Wound History Do you currently have one or more open woundso Yes How many open wounds do you currently haveo 1 Approximately how long have you had your woundso since December How have you been treating your wound(s) until nowo dakins Has your wound(s) ever healed and then re-openedo No Have you had any lab work done in the past montho No Have you tested positive for an antibiotic resistant organism (MRSA, VRE)o No Have you  tested positive for osteomyelitis (bone infection)o No Have you had  any tests for circulation on your legso No Eyes Medical History: Positive for: Cataracts - removed Hematologic/Lymphatic Medical History: Positive for: Anemia Respiratory Medical History: Negative for: Aspiration; Asthma; Chronic Obstructive Pulmonary Disease (COPD); Pneumothorax; Sleep Apnea; Tuberculosis Cardiovascular Medical History: Positive for: Hypertension Negative for: Angina; Arrhythmia; Congestive Heart Failure; Coronary Artery Disease; Deep Vein Thrombosis; Hypotension; Myocardial Infarction; Peripheral Arterial Disease; Peripheral Venous Disease; Phlebitis; Vasculitis Past Medical History Notes: hyperlipidemia Gastrointestinal Medical History: Negative for: Cirrhosis ; Colitis; Crohnos; Hepatitis A; Hepatitis B; Hepatitis C Past Medical History Notes: hernia repair December 2018, colostomy Endocrine Medical History: Positive for: Type II Diabetes SIMRA, FIEBIG ([id number]) Immunological Medical History: Negative for: Lupus Erythematosus; Raynaudos Integumentary (Skin) Medical History: Negative for: History of Burn; History of pressure wounds Musculoskeletal Medical History: Positive for: Rheumatoid Arthritis; Osteoarthritis Neurologic Medical History: Negative for: Dementia; Neuropathy Oncologic Medical History: Positive for: Received Chemotherapy; Received Radiation Past Medical History Notes: rectal cancer 20 years ago HBO Extended History Items Eyes: Cataracts Immunizations Pneumococcal Vaccine: Received Pneumococcal Vaccination: Yes Immunization Notes: up to date Implantable Devices Family and Social History Cancer: Yes - Child; Diabetes: No; Heart Disease: No; Hereditary Spherocytosis: No; Hypertension: No; Kidney Disease: No; Lung Disease: No; Seizures: No; Stroke: No; Thyroid Problems: No; Tuberculosis: No; Never smoker; Marital Status - Widowed; Alcohol Use: Never; Drug Use: No History; Caffeine Use: Rarely; Financial Concerns: No; Food,  Clothing or Shelter Needs: No; Support System Lacking: No; Transportation Concerns: No; Advanced Directives: No; Patient does not want information on Advanced Directives Physician Affirmation I have reviewed and agree with the above information. Electronic Signature(s) Signed: 09/26/2017 4:15:03 PM By: Alejandro Mulling Signed: 09/26/2017 9:02:48 PM By: Bonnell Public Entered By: Bonnell Public on 09/26/2017 14:10:09 [name] ([id number]) -------------------------------------------------------------------------------- SuperBill Details Patient Name: SYNAI, PRETTYMAN Date of Service: 09/26/2017 Medical Record Number: [id number] Patient Account Number: [id number] Date of Birth/Sex: 20-Apr-1929 (84 y.o. Female) Treating RN: Ashok Cordia, Debi Primary Care Provider: Lois Huxley Other Clinician: Referring Provider: Lois Huxley Treating Provider/Extender: Kathreen Cosier in Treatment: 1 Diagnosis Coding ICD-10 Codes Code Description L89.154 Pressure ulcer of sacral region, stage 4 Z99.3 Dependence on wheelchair R54 Age-related physical debility M46.28 Osteomyelitis of vertebra, sacral and sacrococcygeal region E43 Unspecified severe protein-calorie malnutrition Facility Procedures CPT4 Code: [id number] Description: 11042 - DEB SUBQ TISSUE 20 SQ CM/< ICD-10 Diagnosis Description L89.154 Pressure ulcer of sacral region, stage 4 M46.28 Osteomyelitis of vertebra, sacral and sacrococcygeal regio E43 Unspecified severe protein-calorie malnutrition Modifier: n Quantity: 1 Physician Procedures CPT4 Code: [id number] Description: 11042 - WC PHYS SUBQ TISS 20 SQ CM ICD-10 Diagnosis Description L89.154 Pressure ulcer of sacral region, stage 4 M46.28 Osteomyelitis of vertebra, sacral and sacrococcygeal regio E43 Unspecified severe protein-calorie malnutrition Modifier: n Quantity: 1 Electronic Signature(s) Signed: 09/26/2017 2:12:05 PM By: Bonnell Public Entered By: Bonnell Public on 09/26/2017  14:12:04

## 2017-10-03 ENCOUNTER — Ambulatory Visit: Payer: Medicare Other | Admitting: Nurse Practitioner

## 2017-10-04 ENCOUNTER — Encounter: Payer: Medicare Other | Attending: Physician Assistant | Admitting: Physician Assistant

## 2017-10-04 DIAGNOSIS — Z85048 Personal history of other malignant neoplasm of rectum, rectosigmoid junction, and anus: Secondary | ICD-10-CM | POA: Insufficient documentation

## 2017-10-04 DIAGNOSIS — E11622 Type 2 diabetes mellitus with other skin ulcer: Secondary | ICD-10-CM | POA: Diagnosis present

## 2017-10-04 DIAGNOSIS — Z923 Personal history of irradiation: Secondary | ICD-10-CM | POA: Insufficient documentation

## 2017-10-04 DIAGNOSIS — E1151 Type 2 diabetes mellitus with diabetic peripheral angiopathy without gangrene: Secondary | ICD-10-CM | POA: Diagnosis not present

## 2017-10-04 DIAGNOSIS — M069 Rheumatoid arthritis, unspecified: Secondary | ICD-10-CM | POA: Diagnosis not present

## 2017-10-04 DIAGNOSIS — Z933 Colostomy status: Secondary | ICD-10-CM | POA: Diagnosis not present

## 2017-10-04 DIAGNOSIS — D649 Anemia, unspecified: Secondary | ICD-10-CM | POA: Diagnosis not present

## 2017-10-04 DIAGNOSIS — I10 Essential (primary) hypertension: Secondary | ICD-10-CM | POA: Diagnosis not present

## 2017-10-04 DIAGNOSIS — E785 Hyperlipidemia, unspecified: Secondary | ICD-10-CM | POA: Insufficient documentation

## 2017-10-04 DIAGNOSIS — E43 Unspecified severe protein-calorie malnutrition: Secondary | ICD-10-CM | POA: Diagnosis not present

## 2017-10-04 DIAGNOSIS — Z9221 Personal history of antineoplastic chemotherapy: Secondary | ICD-10-CM | POA: Diagnosis not present

## 2017-10-04 DIAGNOSIS — L89154 Pressure ulcer of sacral region, stage 4: Secondary | ICD-10-CM | POA: Diagnosis not present

## 2017-10-06 NOTE — Progress Notes (Signed)
[name] ([id number]) Visit Report for 10/04/2017 Chief Complaint Document Details Patient Name: [name], [name]. Date of Service: 10/04/2017 9:30 AM Medical Record Number: [id number] Patient Account Number: [id number] Date of Birth/Sex: 23-Dec-1928 (84 y.o. F) Treating RN: [name] Primary Care Provider: Lois [name] Other Clinician: Referring Provider: Lois [name] Treating Provider/Extender: [name], [name] Weeks in Treatment: 2 Information Obtained from: Patient Chief Complaint She is here for evaluation of a sacrococcygeal pressure ulcer Electronic Signature(s) Signed: 10/04/2017 5:57:55 PM By: Lenda Kelp PA-C Entered By: Lenda Kelp on 10/04/2017 10:00:21 [name] ([id number]) -------------------------------------------------------------------------------- HPI Details Patient Name: [name] Date of Service: 10/04/2017 9:30 AM Medical Record Number: [id number] Patient Account Number: [id number] Date of Birth/Sex: Oct 08, 1928 (84 y.o. F) Treating RN: [name] Primary Care Provider: Lois [name] Other Clinician: Referring Provider: Lois [name] Treating Provider/Extender: [name], Kanyah Matsushima Weeks in Treatment: 2 History of Present Illness HPI Description: 09/19/17-She is here for initial evaluation of the sacrococcygeal pressure ulcer. She states she was hospitalized for 1 month December-January in [place]; she was out of town for her brother's funeral at the time. While hospitalized she developed a sacral ulcer and was discharged to [facility] and [place]. She is nonambulatory, Hoyer lift dependent. She states she is repositioned while in bed but spends approximately 5-8 hours a day in the wheelchair. She does admit to pain with sitting. She denies being on any antibiotic therapy or any specific evaluation for her sacral ulcer since arriving to rehabilitation. She has a listed allergy to lidocaine, but states this was a  combination medication of Aspercreme with lidocaine that she had a reaction to. She tolerated debridement of nonviable tissue today, there is no overt sign of infection and I expect a swab culture would produce a polymicrobial result which may not reflect an acute infection. We will treat with Dakin's wet-to-dry daily and she will follow-up next week. 09/26/17-She is here for evaluation for stage IV sacral pressure ulcer. Mobile x-ray was performed at her facility and the conclusion reads findings suspicious for osteomyelitis involving the inferior sacrum; we will initiate her on doxycycline and refer her to infectious disease for their input. She had a pre-albumin drawn, level 7; referral for facility dietitian to re-evaluate. There is improvement in amount of non-viable tissue, granulation tissue present; will continue with same treatment plan and follow up next week. 10/04/17 on evaluation today patient presents for follow-up concerning her sacral ulcer. She continues to experience discomfort although we did actually attempt been the cane spray today after discussing this with patient and her son and she seems to have tolerated this very well without any complication. There was no evidence erythema, the pain was improved, and in general I was pleased with this fact. The patient continues to be on doxycycline at this point she is going to be seeing that to [facility] we're not exactly sure when that appointment is we will check on that today. Otherwise the patient seems to be tolerating the dressing changes fairly well her son does state she had a difficult ride here today she does have a new Roho cushion he's unsure if this somehow has affected things and made it worse. Electronic Signature(s) Signed: 10/04/2017 5:57:55 PM By: Lenda Kelp PA-C Entered By: Lenda Kelp on 10/04/2017 10:38:18 [name]  ([id number]) -------------------------------------------------------------------------------- Physical Exam Details Patient Name: [name], [name] Date of Service: 10/04/2017 9:30 AM Medical Record Number: [id number] Patient Account Number: [id number] Date of  Birth/Sex: 12-18-1928 (84 y.o. F) Treating RN: [name] Primary Care Provider: Lois [name] Other Clinician: Referring Provider: Lois [name] Treating Provider/Extender: [name] Weeks in Treatment: 2 Constitutional Well-nourished and well-hydrated in no acute distress. Respiratory normal breathing without difficulty. clear to auscultation bilaterally. Cardiovascular regular rate and rhythm with normal S1, S2. Psychiatric this patient is able to make decisions and demonstrates good insight into disease process. Alert and Oriented x 3. pleasant and cooperative. Notes Currently patient's wound does not have any significant slough noted at this point in time there still some in the base of the wound but she was tender even to palpation I'm not sure that she would really tolerate much in the way of debridement. She does have some bone exposed that around the 12 to 2 o'clock location although this is minimal. That is where she had pain however. Electronic Signature(s) Signed: 10/04/2017 5:57:55 PM By: Lenda Kelp PA-C Entered By: Lenda Kelp on 10/04/2017 10:39:44 [name] ([id number]) -------------------------------------------------------------------------------- Physician Orders Details Patient Name: [name], [name] Date of Service: 10/04/2017 9:30 AM Medical Record Number: [id number] Patient Account Number: [id number] Date of Birth/Sex: 24-Mar-1929 (84 y.o. F) Treating RN: [name] Primary Care Provider: Lois [name] Other Clinician: Referring Provider: Lois [name] Treating Provider/Extender: [name], Sashia Campas Weeks in Treatment: 2 Verbal / Phone Orders: No Diagnosis Coding ICD-10 Coding Code  Description L89.154 Pressure ulcer of sacral region, stage 4 Z99.3 Dependence on wheelchair R54 Age-related physical debility M46.28 Osteomyelitis of vertebra, sacral and sacrococcygeal region E43 Unspecified severe protein-calorie malnutrition Wound Cleansing Wound #1 Sacrum o Clean wound with Normal Saline. Anesthetic (add to Medication List) Wound #1 Sacrum o Topical Lidocaine 4% cream applied to wound bed prior to debridement (In Clinic Only). Skin Barriers/Peri-Wound Care Wound #1 Sacrum o Skin Prep Primary Wound Dressing Wound #1 Sacrum o Other: - Dakin's Solution moisten gauze Secondary Dressing Wound #1 Sacrum o Dry Gauze o Boardered Foam Dressing Dressing Change Frequency Wound #1 Sacrum o Change dressing every day. Follow-up Appointments Wound #1 Sacrum o Return Appointment in 1 week. Off-Loading Wound #1 Sacrum o Mattress - Group II Ingman, Lashawnta J. ([id number]) o Turn and reposition every 2 hours Additional Orders / Instructions Wound #1 Sacrum o Increase protein intake. o Other: - Vitamin C, Zinc Dietitian Evaluation for protein intake adjustments Notes Please assist patient with adequate pain management as she is voicing poor pain control with this wound. Electronic Signature(s) Signed: 10/04/2017 3:34:53 PM By: [name] Signed: 10/04/2017 5:57:55 PM By: Lenda Kelp PA-C Entered By: [name] on 10/04/2017 10:10:09 [name] ([id number]) -------------------------------------------------------------------------------- Problem List Details Patient Name: [name], [name]. Date of Service: 10/04/2017 9:30 AM Medical Record Number: [id number] Patient Account Number: [id number] Date of Birth/Sex: 12/30/1928 (84 y.o. F) Treating RN: [name] Primary Care Provider: Lois [name] Other Clinician: Referring Provider: Lois [name] Treating Provider/Extender: [name], Taheera Thomann Weeks in Treatment: 2 Active  Problems ICD-10 Impacting Encounter Code Description Active Date Wound Healing Diagnosis L89.154 Pressure ulcer of sacral region, stage 4 09/19/2017 Yes Z99.3 Dependence on wheelchair 09/19/2017 Yes R54 Age-related physical debility 09/19/2017 Yes M46.28 Osteomyelitis of vertebra, sacral and sacrococcygeal region 09/26/2017 Yes E43 Unspecified severe protein-calorie malnutrition 09/26/2017 Yes Inactive Problems Resolved Problems Electronic Signature(s) Signed: 10/04/2017 5:57:55 PM By: Lenda Kelp PA-C Entered By: Lenda Kelp on 10/04/2017 10:00:14 [name] ([id number]) -------------------------------------------------------------------------------- Progress Note Details Patient Name: [name]. Date of Service: 10/04/2017 9:30 AM Medical Record Number: [id number] Patient  Account Number: [id number] Date of Birth/Sex: 07/15/1928 (84 y.o. F) Treating RN: [name] Primary Care Provider: Lois [name] Other Clinician: Referring Provider: Lois [name] Treating Provider/Extender: [name], Joelynn Dust Weeks in Treatment: 2 Subjective Chief Complaint Information obtained from Patient She is here for evaluation of a sacrococcygeal pressure ulcer History of Present Illness (HPI) 09/19/17-She is here for initial evaluation of the sacrococcygeal pressure ulcer. She states she was hospitalized for 1 month December-January in [place]; she was out of town for her brother's funeral at the time. While hospitalized she developed a sacral ulcer and was discharged to [facility] and [place]. She is nonambulatory, Hoyer lift dependent. She states she is repositioned while in bed but spends approximately 5-8 hours a day in the wheelchair. She does admit to pain with sitting. She denies being on any antibiotic therapy or any specific evaluation for her sacral ulcer since arriving to rehabilitation. She has a listed allergy to lidocaine, but states this was a combination  medication of Aspercreme with lidocaine that she had a reaction to. She tolerated debridement of nonviable tissue today, there is no overt sign of infection and I expect a swab culture would produce a polymicrobial result which may not reflect an acute infection. We will treat with Dakin's wet-to-dry daily and she will follow-up next week. 09/26/17-She is here for evaluation for stage IV sacral pressure ulcer. Mobile x-ray was performed at her facility and the conclusion reads findings suspicious for osteomyelitis involving the inferior sacrum; we will initiate her on doxycycline and refer her to infectious disease for their input. She had a pre-albumin drawn, level 7; referral for facility dietitian to re-evaluate. There is improvement in amount of non-viable tissue, granulation tissue present; will continue with same treatment plan and follow up next week. 10/04/17 on evaluation today patient presents for follow-up concerning her sacral ulcer. She continues to experience discomfort although we did actually attempt been the cane spray today after discussing this with patient and her son and she seems to have tolerated this very well without any complication. There was no evidence erythema, the pain was improved, and in general I was pleased with this fact. The patient continues to be on doxycycline at this point she is going to be seeing that to [facility] we're not exactly sure when that appointment is we will check on that today. Otherwise the patient seems to be tolerating the dressing changes fairly well her son does state she had a difficult ride here today she does have a new Roho cushion he's unsure if this somehow has affected things and made it worse. Patient History Information obtained from Patient. Family History Cancer - Child, No family history of Diabetes, Heart Disease, Hereditary Spherocytosis, Hypertension, Kidney Disease, Lung Disease, Seizures, Stroke, Thyroid Problems,  Tuberculosis. Social History Never smoker, Marital Status - Widowed, Alcohol Use - Never, Drug Use - No History, Caffeine Use - Rarely. Medical And Surgical History Notes Cardiovascular hyperlipidemia Gastrointestinal hernia repair December 2018, colostomy Oncologic [name], [name]. ([id number]) rectal cancer 20 years ago Review of Systems (ROS) Constitutional Symptoms (General Health) Denies complaints or symptoms of Fever, Chills. Respiratory The patient has no complaints or symptoms. Cardiovascular The patient has no complaints or symptoms. Psychiatric The patient has no complaints or symptoms. Objective Constitutional Well-nourished and well-hydrated in no acute distress. Vitals Time Taken: 9:33 AM, Temperature: 98.0 F, Pulse: 81 bpm, Respiratory Rate: 18 breaths/min, Blood Pressure: 131/63 mmHg. Respiratory normal breathing without difficulty. clear to auscultation bilaterally. Cardiovascular regular  rate and rhythm with normal S1, S2. Psychiatric this patient is able to make decisions and demonstrates good insight into disease process. Alert and Oriented x 3. pleasant and cooperative. General Notes: Currently patient's wound does not have any significant slough noted at this point in time there still some in the base of the wound but she was tender even to palpation I'm not sure that she would really tolerate much in the way of debridement. She does have some bone exposed that around the 12 to 2 o'clock location although this is minimal. That is where she had pain however. Integumentary (Hair, Skin) Wound #1 status is Open. Original cause of wound was Pressure Injury. The wound is located on the Sacrum. The wound measures 4.5cm length x 4cm width x 1.4cm depth; 14.137cm^2 area and 19.792cm^3 volume. There is bone and Fat Layer (Subcutaneous Tissue) Exposed exposed. There is no tunneling noted, however, there is undermining starting at 12:00 and ending at 12:00 with a  maximum distance of 1.4cm. There is a large amount of purulent drainage noted. Foul odor after cleansing was noted. The wound margin is flat and intact. There is medium (34-66%) pink granulation within the wound bed. There is a medium (34-66%) amount of necrotic tissue within the wound bed including Eschar and Adherent Slough. The periwound skin appearance did not exhibit: Callus, Crepitus, Excoriation, Induration, Rash, Scarring, Dry/Scaly, Maceration, Atrophie Blanche, Cyanosis, Ecchymosis, Hemosiderin Staining, Mottled, Pallor, Rubor, Erythema. Periwound temperature was noted as No Abnormality. The periwound has tenderness on palpation. [name], [name] ([id number]) Assessment Active Problems ICD-10 L89.154 - Pressure ulcer of sacral region, stage 4 Z99.3 - Dependence on wheelchair R54 - Age-related physical debility M46.28 - Osteomyelitis of vertebra, sacral and sacrococcygeal region E43 - Unspecified severe protein-calorie malnutrition Plan Wound Cleansing: Wound #1 Sacrum: Clean wound with Normal Saline. Anesthetic (add to Medication List): Wound #1 Sacrum: Topical Lidocaine 4% cream applied to wound bed prior to debridement (In Clinic Only). Skin Barriers/Peri-Wound Care: Wound #1 Sacrum: Skin Prep Primary Wound Dressing: Wound #1 Sacrum: Other: - Dakin's Solution moisten gauze Secondary Dressing: Wound #1 Sacrum: Dry Gauze Boardered Foam Dressing Dressing Change Frequency: Wound #1 Sacrum: Change dressing every day. Follow-up Appointments: Wound #1 Sacrum: Return Appointment in 1 week. Off-Loading: Wound #1 Sacrum: Mattress - Group II Turn and reposition every 2 hours Additional Orders / Instructions: Wound #1 Sacrum: Increase protein intake. Other: - Vitamin C, Zinc Dietitian Evaluation for protein intake adjustments General Notes: Please assist patient with adequate pain management as she is voicing poor pain control with this wound. I am going to suggest at  this point that we continue with the Current wound care measures since she seems to be doing rather well with this currently. Patient and her son are in agreement with plan. Her son did inquire about the possibility of her going to [facility] to a wound care specialist there. Again I explained I'm really not aware of what they have to offer although I'm not opposed to them getting the second opinion if they would like to do so. He states he really would like to see what we [name], [name]. ([id number]) recommend first before headed that direction. I'm gonna suggest that we see what Dr. Sampson Goon has to say concerning an infection and antibiotics. Subsequently once everything is stabilized as far as the antibiotic regimen is concerned my suggestion would likely be that will be at the point we can switch over to a Wound VAC to try to help  with healing. They are in agreement with this plan as a possibility. We will subsequently see were things stand in one week when she comes back for reevaluation. Please see above for specific wound care orders. We will see patient for re-evaluation in 1 week(s) here in the clinic. If anything worsens or changes patient will contact our office for additional recommendations. Electronic Signature(s) Signed: 10/04/2017 5:57:55 PM By: Lenda Kelp PA-C Entered By: Lenda Kelp on 10/04/2017 10:41:28 [place], [name] ([id number]) -------------------------------------------------------------------------------- ROS/PFSH Details Patient Name: [name], [name] Date of Service: 10/04/2017 9:30 AM Medical Record Number: [id number] Patient Account Number: [id number] Date of Birth/Sex: 12/31/1928 (84 y.o. F) Treating RN: [name] Primary Care Provider: Lois [name] Other Clinician: Referring Provider: Lois [name] Treating Provider/Extender: [name], Samual Beals Weeks in Treatment: 2 Information Obtained From Patient Wound History Do you currently have one or more  open woundso Yes How many open wounds do you currently haveo 1 Approximately how long have you had your woundso since December How have you been treating your wound(s) until nowo dakins Has your wound(s) ever healed and then re-openedo No Have you had any lab work done in the past montho No Have you tested positive for an antibiotic resistant organism (MRSA, VRE)o No Have you tested positive for osteomyelitis (bone infection)o No Have you had any tests for circulation on your legso No Constitutional Symptoms (General Health) Complaints and Symptoms: Negative for: Fever; Chills Eyes Medical History: Positive for: Cataracts - removed Hematologic/Lymphatic Medical History: Positive for: Anemia Respiratory Complaints and Symptoms: No Complaints or Symptoms Medical History: Negative for: Aspiration; Asthma; Chronic Obstructive Pulmonary Disease (COPD); Pneumothorax; Sleep Apnea; Tuberculosis Cardiovascular Complaints and Symptoms: No Complaints or Symptoms Medical History: Positive for: Hypertension Negative for: Angina; Arrhythmia; Congestive Heart Failure; Coronary Artery Disease; Deep Vein Thrombosis; Hypotension; Myocardial Infarction; Peripheral Arterial Disease; Peripheral Venous Disease; Phlebitis; Vasculitis Past Medical History Notes: hyperlipidemia [name], [name] ([id number]) Gastrointestinal Medical History: Negative for: Cirrhosis ; Colitis; Crohnos; Hepatitis A; Hepatitis B; Hepatitis C Past Medical History Notes: hernia repair December 2018, colostomy Endocrine Medical History: Positive for: Type II Diabetes Immunological Medical History: Negative for: Lupus Erythematosus; Raynaudos Integumentary (Skin) Medical History: Negative for: History of Burn; History of pressure wounds Musculoskeletal Medical History: Positive for: Rheumatoid Arthritis; Osteoarthritis Neurologic Medical History: Negative for: Dementia; Neuropathy Oncologic Medical  History: Positive for: Received Chemotherapy; Received Radiation Past Medical History Notes: rectal cancer 20 years ago Psychiatric Complaints and Symptoms: No Complaints or Symptoms HBO Extended History Items Eyes: Cataracts Immunizations Pneumococcal Vaccine: Received Pneumococcal Vaccination: Yes Immunization Notes: up to date Implantable Devices Family and Social History Cancer: Yes - Child; Diabetes: No; Heart Disease: No; Hereditary Spherocytosis: No; Hypertension: No; Kidney Disease: No; Lung Disease: No; Seizures: No; Stroke: No; Thyroid Problems: No; Tuberculosis: No; Never smoker; Marital Status - [name], [name] J. ([id number]) Widowed; Alcohol Use: Never; Drug Use: No History; Caffeine Use: Rarely; Financial Concerns: No; Food, Clothing or Shelter Needs: No; Support System Lacking: No; Transportation Concerns: No; Advanced Directives: No; Patient does not want information on Advanced Directives Physician Affirmation I have reviewed and agree with the above information. Electronic Signature(s) Signed: 10/04/2017 3:34:53 PM By: [name] Signed: 10/04/2017 5:57:55 PM By: Lenda Kelp PA-C Entered By: Lenda Kelp on 10/04/2017 10:38:55 [place], [name] ([id number]) -------------------------------------------------------------------------------- SuperBill Details Patient Name: [name], [name] Date of Service: 10/04/2017 Medical Record Number: [id number] Patient Account Number: [id number] Date of Birth/Sex: September 05, 1928 (84 y.o. F) Treating RN: [name] Primary Care  Provider: Lois [name] Other Clinician: Referring Provider: Lois [name] Treating Provider/Extender: [name], Darian Cansler Weeks in Treatment: 2 Diagnosis Coding ICD-10 Codes Code Description L89.154 Pressure ulcer of sacral region, stage 4 Z99.3 Dependence on wheelchair R54 Age-related physical debility M46.28 Osteomyelitis of vertebra, sacral and sacrococcygeal region E43 Unspecified severe  protein-calorie malnutrition Facility Procedures CPT4 Code: [id number] Description: 99213 - WOUND CARE VISIT-LEV 3 EST PT Modifier: Quantity: 1 Physician Procedures CPT4 Code: [id number] Description: 99213 - WC PHYS LEVEL 3 - EST PT ICD-10 Diagnosis Description L89.154 Pressure ulcer of sacral region, stage 4 Z99.3 Dependence on wheelchair R54 Age-related physical debility M46.28 Osteomyelitis of vertebra, sacral and sacrococcygeal reg Modifier: ion Quantity: 1 Electronic Signature(s) Signed: 10/04/2017 5:57:55 PM By: Lenda Kelp PA-C Entered By: Lenda Kelp on 10/04/2017 10:42:55

## 2017-10-08 NOTE — Progress Notes (Signed)
[name] ([id number]) Visit Report for 10/04/2017 Arrival Information Details Patient Name: [name], [name]. Date of Service: 10/04/2017 9:30 AM Medical Record Number: [id number] Patient Account Number: [id number] Date of Birth/Sex: 06/29/1929 (84 y.o. F) Treating RN: [name] Primary Care [name]: [name] Other Clinician: Referring [name]: [name] Treating [name]/Extender: [name], [name] Weeks in Treatment: 2 Visit Information History Since Last Visit All ordered tests and consults were completed: No Patient Arrived: Wheel Chair Added or deleted any medications: No Arrival Time: 09:32 Any new allergies or adverse reactions: No Accompanied By: son Had a fall or experienced change in No Transfer Assistance: None activities of daily living that may affect Patient Identification Verified: Yes risk of falls: Secondary Verification Process Completed: Yes Signs or symptoms of abuse/neglect since last visito No Patient Has Alerts: Yes Hospitalized since last visit: No Patient Alerts: DMII Implantable device outside of the clinic excluding No NO LIDOCAINE cellular tissue based products placed in the center since last visit: Pain Present Now: Yes Electronic Signature(s) Signed: 10/04/2017 10:00:44 AM By: [name] Entered By: [name] ([id number]) -------------------------------------------------------------------------------- Clinic Level of Care Assessment Details Patient Name: [name]. Date of Service: 10/04/2017 9:30 AM Medical Record Number: [id number] Patient Account Number: [id number] Date of Birth/Sex: [birth year] (84 y.o. F) Treating RN: [name] Primary Care Jurnei Latini: [name] Other Clinician: Referring Lenor Provencher: [name] Treating Kisean Rollo/Extender: [name], [name] Weeks in Treatment: 2 Clinic Level of Care Assessment Items TOOL 4 Quantity Score []  - Use when only an EandM  is performed on FOLLOW-UP visit 0 ASSESSMENTS - Nursing Assessment / Reassessment X - Reassessment of Co-morbidities (includes updates in patient status) 1 10 X- 1 5 Reassessment of Adherence to Treatment Plan ASSESSMENTS - Wound and Skin Assessment / Reassessment X - Simple Wound Assessment / Reassessment - one wound 1 5 []  - 0 Complex Wound Assessment / Reassessment - multiple wounds []  - 0 Dermatologic / Skin Assessment (not related to wound area) ASSESSMENTS - Focused Assessment []  - Circumferential Edema Measurements - multi extremities 0 []  - 0 Nutritional Assessment / Counseling / Intervention []  - 0 Lower Extremity Assessment (monofilament, tuning fork, pulses) []  - 0 Peripheral Arterial Disease Assessment (using hand held doppler) ASSESSMENTS - Ostomy and/or Continence Assessment and Care []  - Incontinence Assessment and Management 0 []  - 0 Ostomy Care Assessment and Management (repouching, etc.) PROCESS - Coordination of Care X - Simple Patient / Family Education for ongoing care 1 15 []  - 0 Complex (extensive) Patient / Family Education for ongoing care []  - 0 Staff obtains , Records, Test Results / Process Orders []  - 0 Staff telephones HHA, Nursing Homes / Clarify orders / etc []  - 0 Routine Transfer to another Facility (non-emergent condition) []  - 0 Routine Hospital Admission (non-emergent condition) []  - 0 New Admissions / / Ordering NPWT, Apligraf, etc. []  - 0 Emergency Hospital Admission (emergent condition) X- 1 10 Simple Discharge Coordination [name], [name]. ( ) []  - 0 Complex (extensive) Discharge Coordination PROCESS - Special Needs []  - Pediatric / Minor Patient Management 0 []  - 0 Isolation Patient Management []  - 0 Hearing / Language / Visual special needs []  - 0 Assessment of Community assistance (transportation, D/C planning, etc.) []  - 0 Additional assistance / Altered mentation []  -  0 Support Surface(s) Assessment (bed, cushion, seat, etc.) INTERVENTIONS - Wound Cleansing / Measurement X - Simple Wound Cleansing - one wound 1 5 []  - 0  Complex Wound Cleansing - multiple wounds X- 1 5 Wound Imaging (photographs - any number of wounds) []  - 0 Wound Tracing (instead of photographs) X- 1 5 Simple Wound Measurement - one wound []  - 0 Complex Wound Measurement - multiple wounds INTERVENTIONS - Wound Dressings X - Small Wound Dressing one or multiple wounds 1 10 []  - 0 Medium Wound Dressing one or multiple wounds []  - 0 Large Wound Dressing one or multiple wounds X- 1 5 Application of Medications - topical []  - 0 Application of Medications - injection INTERVENTIONS - Miscellaneous []  - External ear exam 0 []  - 0 Specimen Collection (cultures, biopsies, blood, body fluids, etc.) []  - 0 Specimen(s) / Culture(s) sent or taken to Lab for analysis X- 1 10 Patient Transfer (multiple staff / Nurse, adult / Similar devices) []  - 0 Simple Staple / Suture removal (25 or less) []  - 0 Complex Staple / Suture removal (26 or more) []  - 0 Hypo / Hyperglycemic Management (close monitor of Blood Glucose) []  - 0 Ankle / Brachial Index (ABI) - do not check if billed separately X- 1 5 Vital Signs Fennelly, [name] J. ([id number]) Has the patient been seen at the hospital within the last three years: Yes Total Score: 90 Level Of Care: New/Established - Level 3 Electronic Signature(s) Signed: 10/04/2017 3:34:53 PM By: [name] Entered By: [name] on 10/04/2017 10:11:02 [name] ([id number]) -------------------------------------------------------------------------------- Encounter Discharge Information Details Patient Name: [name], [name]. Date of Service: 10/04/2017 9:30 AM Medical Record Number: [id number] Patient Account Number: [id number] Date of Birth/Sex: [birth year] (84 y.o. F) Treating RN: [name] Primary Care Lyon Dumont: [name] Other  Clinician: Referring Jessly Lebeck: [name] Treating Kasheena Sambrano/Extender: [name], [name] Weeks in Treatment: 2 Encounter Discharge Information Items Discharge Pain Level: 0 Discharge Condition: Stable Ambulatory Status: Wheelchair Discharge Destination: Nursing Home Transportation: Other Accompanied By: son Schedule Follow-up Appointment: Yes Medication Reconciliation completed and No provided to Patient/Care Jere Vanburen: Provided on Clinical Summary of Care: 10/04/2017 Form Type Recipient Paper Patient SM Electronic Signature(s) Signed: 10/07/2017 4:32:12 PM By: Alejandro Mulling Entered By: Alejandro Mulling on 10/04/2017 10:37:21 [name], [name] ([id number]) -------------------------------------------------------------------------------- Lower Extremity Assessment Details Patient Name: [name]. Date of Service: 10/04/2017 9:30 AM Medical Record Number: [id number] Patient Account Number: [id number] Date of Birth/Sex: 08-Nov-1928 (84 y.o. F) Treating RN: [name] Primary Care Valita Righter: [name] Other Clinician: Referring Kealy Lewter: [name] Treating Cailyn Houdek/Extender: [name], [name] Weeks in Treatment: 2 Electronic Signature(s) Signed: 10/04/2017 10:00:44 AM By: [name] Entered By: [name] on 10/04/2017 09:46:08 [name], [name] ([id number]) -------------------------------------------------------------------------------- Multi Wound Chart Details Patient Name: [name], [name] Date of Service: 10/04/2017 9:30 AM Medical Record Number: [id number] Patient Account Number: [id number] Date of Birth/Sex: 05/17/1929 (84 y.o. F) Treating RN: [name] Primary Care Maisee Vollman: [name] Other Clinician: Referring Hisae Decoursey: [name] Treating Amity Roes/Extender: STONE III, [name] Weeks in Treatment: 2 Vital Signs Height(in): Pulse(bpm): 81 Weight(lbs): Blood Pressure(mmHg): 131/63 Body Mass Index(BMI): Temperature(F): 98.0 Respiratory  Rate 18 (breaths/min): Photos: [N/A:N/A] Wound Location: Sacrum N/A N/A Wounding Event: Pressure Injury N/A N/A Primary Etiology: Pressure Ulcer N/A N/A Comorbid History: Cataracts, Anemia, N/A N/A Hypertension, Type II Diabetes, Rheumatoid Arthritis, Osteoarthritis, Received Chemotherapy, Received Radiation Date Acquired: 07/08/2017 N/A N/A Weeks of Treatment: 2 N/A N/A Wound Status: Open N/A N/A Measurements L x W x D 4.5x4x1.4 N/A N/A (cm) Area (cm) : 14.137 N/A N/A Volume (cm) : 19.792 N/A N/A % Reduction in Area: -5.30% N/A N/A % Reduction in  Volume: -5.30% N/A N/A Starting Position 1 12 (o'clock): Ending Position 1 12 (o'clock): Maximum Distance 1 (cm): 1.4 Undermining: Yes N/A N/A Classification: Category/Stage IV N/A N/A Exudate Amount: Large N/A N/A Exudate Type: Purulent N/A N/A Exudate Color: yellow, brown, green N/A N/A Foul Odor After Cleansing: Yes N/A N/A [name], [name] ([id number]) Odor Anticipated Due to No N/A N/A Product Use: Wound Margin: Flat and Intact N/A N/A Granulation Amount: Medium (34-66%) N/A N/A Granulation Quality: Pink N/A N/A Necrotic Amount: Medium (34-66%) N/A N/A Necrotic Tissue: Eschar, Adherent Slough N/A N/A Exposed Structures: Fat Layer (Subcutaneous N/A N/A Tissue) Exposed: Yes Bone: Yes Fascia: No Tendon: No Muscle: No Joint: No Epithelialization: None N/A N/A Periwound Skin Texture: Excoriation: No N/A N/A Induration: No Callus: No Crepitus: No Rash: No Scarring: No Periwound Skin Moisture: Maceration: No N/A N/A Dry/Scaly: No Periwound Skin Color: Atrophie Blanche: No N/A N/A Cyanosis: No Ecchymosis: No Erythema: No Hemosiderin Staining: No Mottled: No Pallor: No Rubor: No Temperature: No Abnormality N/A N/A Tenderness on Palpation: Yes N/A N/A Wound Preparation: Ulcer Cleansing: N/A N/A Rinsed/Irrigated with Saline Topical Anesthetic Applied: None, Other: hurricaine spray Treatment  Notes Electronic Signature(s) Signed: 10/04/2017 3:34:53 PM By: [name] Entered By: [name] on 10/04/2017 10:03:30 [name] ([id number]) -------------------------------------------------------------------------------- Multi-Disciplinary Care Plan Details Patient Name: [name], [name]. Date of Service: 10/04/2017 9:30 AM Medical Record Number: [id number] Patient Account Number: [id number] Date of Birth/Sex: 1928-10-22 (84 y.o. F) Treating RN: [name] Primary Care Tiasha Helvie: [name] Other Clinician: Referring Wilmer Santillo: [name] Treating Teagen Mcleary/Extender: [name], [name] Weeks in Treatment: 2 Active Inactive ` Abuse / Safety / Falls / Self Care Management Nursing Diagnoses: Potential for falls Goals: Patient will not experience any injury related to falls Date Initiated: 09/19/2017 Target Resolution Date: 01/04/2018 Goal Status: Active Interventions: Assess Activities of Daily Living upon admission and as needed Assess fall risk on admission and as needed Assess: immobility, friction, shearing, incontinence upon admission and as needed Assess impairment of mobility on admission and as needed per policy Notes: ` Nutrition Nursing Diagnoses: Imbalanced nutrition Potential for alteratiion in Nutrition/Potential for imbalanced nutrition Goals: Patient/caregiver agrees to and verbalizes understanding of need to use nutritional supplements and/or vitamins as prescribed Date Initiated: 09/19/2017 Target Resolution Date: 01/04/2018 Goal Status: Active Interventions: Assess patient nutrition upon admission and as needed per policy Notes: ` Orientation to the Wound Care Program Nursing Diagnoses: Knowledge deficit related to the wound healing center program Goals: Patient/caregiver will verbalize understanding of the Wound Healing Center Program [place], JENEVA SCHWEIZER ([id number]) Date Initiated: 09/19/2017 Target Resolution Date: 10/05/2017 Goal Status:  Active Interventions: Provide education on orientation to the wound center Notes: ` Pain, Acute or Chronic Nursing Diagnoses: Pain, acute or chronic: actual or potential Potential alteration in comfort, pain Goals: Patient/caregiver will verbalize adequate pain control between visits Date Initiated: 09/19/2017 Target Resolution Date: 01/04/2018 Goal Status: Active Interventions: Complete pain assessment as per visit requirements Encourage patient to take pain medications as prescribed Notes: ` Wound/Skin Impairment Nursing Diagnoses: Impaired tissue integrity Knowledge deficit related to ulceration/compromised skin integrity Goals: Ulcer/skin breakdown will have a volume reduction of 80% by week 12 Date Initiated: 09/19/2017 Target Resolution Date: 01/04/2018 Goal Status: Active Interventions: Assess patient/caregiver ability to perform ulcer/skin care regimen upon admission and as needed Assess ulceration(s) every visit Notes: Electronic Signature(s) Signed: 10/04/2017 3:34:53 PM By: [name] Entered By: [name] on 10/04/2017 10:03:14 [name] ([id number]) -------------------------------------------------------------------------------- Pain Assessment Details Patient Name: [name].  Date of Service: 10/04/2017 9:30 AM Medical Record Number: [id number] Patient Account Number: [id number] Date of Birth/Sex: 03/24/1929 (84 y.o. F) Treating RN: [name] Primary Care Ellouise Mcwhirter: [name] Other Clinician: Referring Stasha Naraine: [name] Treating Qunisha Bryk/Extender: [name], [name] Weeks in Treatment: 2 Active Problems Location of Pain Severity and Description of Pain Patient Has Paino Yes Site Locations Pain Location: Pain in Ulcers Duration of the Pain. Constant / Intermittento Constant Rate the pain. Current Pain Level: 9 Character of Pain Describe the Pain: Burning Pain Management and Medication Current Pain Management: Electronic  Signature(s) Signed: 10/04/2017 10:00:44 AM By: [name] Entered By: [name] on 10/04/2017 09:33:17 [name] ([id number]) -------------------------------------------------------------------------------- Patient/Caregiver Education Details Patient Name: HIAWATHA, SCHMALTZ. Date of Service: 10/04/2017 9:30 AM Medical Record Number: [id number] Patient Account Number: [id number] Date of Birth/Gender: 1928/11/14 (84 y.o. F) Treating RN: Phillis Haggis Primary Care Physician: [name] Other Clinician: Referring Physician: Lois [name] Treating Physician/Extender: Skeet Simmer in Treatment: 2 Education Assessment Education Provided To: Patient Education Topics Provided Wound/Skin Impairment: Handouts: Caring for Your Ulcer, Other: change dressing as ordered Methods: Demonstration, Explain/Verbal Responses: State content correctly Electronic Signature(s) Signed: 10/07/2017 4:32:12 PM By: Alejandro Mulling Entered By: Alejandro Mulling on 10/04/2017 10:37:39 [name], [name] ([id number]) -------------------------------------------------------------------------------- Wound Assessment Details Patient Name: [name]. Date of Service: 10/04/2017 9:30 AM Medical Record Number: [id number] Patient Account Number: [id number] Date of Birth/Sex: 1928/07/13 (84 y.o. F) Treating RN: [name] Primary Care Eshaal Duby: [name] Other Clinician: Referring Chayim Bialas: [name] Treating Charlton Boule/Extender: [name], [name] Weeks in Treatment: 2 Wound Status Wound Number: 1 Primary Pressure Ulcer Etiology: Wound Location: Sacrum Wound Open Wounding Event: Pressure Injury Status: Date Acquired: 07/08/2017 Comorbid Cataracts, Anemia, Hypertension, Type II Weeks Of Treatment: 2 History: Diabetes, Rheumatoid Arthritis, Osteoarthritis, Clustered Wound: No Received Chemotherapy, Received Radiation Photos Photo Uploaded By: [name] on 10/04/2017  10:00:00 Wound Measurements Length: (cm) 4.5 Width: (cm) 4 Depth: (cm) 1.4 Area: (cm) 14.137 Volume: (cm) 19.792 % Reduction in Area: -5.3% % Reduction in Volume: -5.3% Epithelialization: None Tunneling: No Undermining: Yes Starting Position (o'clock): 12 Ending Position (o'clock): 12 Maximum Distance: (cm) 1.4 Wound Description Classification: Category/Stage IV Wound Margin: Flat and Intact Exudate Amount: Large Exudate Type: Purulent Exudate Color: yellow, brown, green Foul Odor After Cleansing: Yes Due to Product Use: No Slough/Fibrino No Wound Bed Granulation Amount: Medium (34-66%) Exposed Structure Granulation Quality: Pink Fascia Exposed: No Necrotic Amount: Medium (34-66%) Fat Layer (Subcutaneous Tissue) Exposed: Yes Necrotic Quality: Eschar, Adherent Slough Tendon Exposed: No Muscle Exposed: No [name], [name] J. ([id number]) Joint Exposed: No Bone Exposed: Yes Periwound Skin Texture Texture Color No Abnormalities Noted: No No Abnormalities Noted: No Callus: No Atrophie Blanche: No Crepitus: No Cyanosis: No Excoriation: No Ecchymosis: No Induration: No Erythema: No Rash: No Hemosiderin Staining: No Scarring: No Mottled: No Pallor: No Moisture Rubor: No No Abnormalities Noted: No Dry / Scaly: No Temperature / Pain Maceration: No Temperature: No Abnormality Tenderness on Palpation: Yes Wound Preparation Ulcer Cleansing: Rinsed/Irrigated with Saline Topical Anesthetic Applied: None, Other: hurricaine spray, Treatment Notes Wound #1 (Sacrum) 1. Cleansed with: Clean wound with Normal Saline 2. Anesthetic Hurricaine Topical Anesthetic Spray 3. Peri-wound Care: Skin Prep 4. Dressing Applied: Other dressing (specify in notes) 5. Secondary Dressing Applied Bordered Foam Dressing Dry Gauze Notes dakins soaked gauze and covered with border foam gauze Electronic Signature(s) Signed: 10/04/2017 10:00:44 AM By: [name] Entered By:  [name] on 10/04/2017 09:45:58 [name], [name] ([id number]) --------------------------------------------------------------------------------  Vitals Details Patient Name: LAYTON, TAPPAN. Date of Service: 10/04/2017 9:30 AM Medical Record Number: [id number] Patient Account Number: [id number] Date of Birth/Sex: January 19, 1929 (84 y.o. F) Treating RN: [name] Primary Care Dmarcus Decicco: [name] Other Clinician: Referring Ronne Stefanski: [name] Treating Reva Pinkley/Extender: [name], [name] Weeks in Treatment: 2 Vital Signs Time Taken: 09:33 Temperature (F): 98.0 Pulse (bpm): 81 Respiratory Rate (breaths/min): 18 Blood Pressure (mmHg): 131/63 Reference Range: 80 - 120 mg / dl Electronic Signature(s) Signed: 10/04/2017 10:00:44 AM By: [name] Entered By: [name] on 10/04/2017 09:33:42

## 2017-10-11 ENCOUNTER — Ambulatory Visit: Payer: Medicare Other | Admitting: Physician Assistant

## 2017-10-18 ENCOUNTER — Encounter: Payer: Medicare Other | Admitting: Physician Assistant

## 2017-10-18 DIAGNOSIS — E11622 Type 2 diabetes mellitus with other skin ulcer: Secondary | ICD-10-CM | POA: Diagnosis not present

## 2017-10-25 ENCOUNTER — Encounter: Payer: Medicare Other | Admitting: Physician Assistant

## 2017-10-25 DIAGNOSIS — E11622 Type 2 diabetes mellitus with other skin ulcer: Secondary | ICD-10-CM | POA: Diagnosis not present

## 2017-10-26 NOTE — Progress Notes (Signed)
[name], [name] ([id number]) Visit Report for 10/18/2017 Arrival Information Details Patient Name: [name], [name]. Date of Service: 10/18/2017 2:15 PM Medical Record Number: [id number] Patient Account Number: [id number] Date of Birth/Sex: 03-Jan-1929 (84 y.o. F) Treating RN: Renne Crigler Primary Care [name]: Lois Huxley Other Clinician: Referring Kapono Luhn: Lois Huxley Treating [name]/Extender: [name], [name]: 4 Visit Information History Since Last Visit All ordered tests and consults were completed: No Patient Arrived: Wheel Chair Added or deleted any medications: No Arrival Time: 14:14 Any new allergies or adverse reactions: No Accompanied By: driver Had a fall or experienced change in No Transfer Assistance: None activities of daily living that may affect Patient Identification Verified: Yes risk of falls: Secondary Verification Process Completed: Yes Signs or symptoms of abuse/neglect since last visito No Patient Has Alerts: Yes Hospitalized since last visit: No Patient Alerts: DMII Implantable device outside of the clinic excluding No NO LIDOCAINE cellular tissue based products placed in the center since last visit: Pain Present Now: Yes Electronic Signature(s) Signed: 10/21/2017 4:40:07 PM By: Renne Crigler Entered By: Renne Crigler on 10/18/2017 14:14:54 [name], [name] ([id number]) -------------------------------------------------------------------------------- Encounter Discharge Information Details Patient Name: [name], [name]. Date of Service: 10/18/2017 2:15 PM Medical Record Number: [id number] Patient Account Number: [id number] Date of Birth/Sex: 07/25/1928 (84 y.o. F) Treating RN: Curtis Sites Primary Care [name]: Lois Huxley Other Clinician: Referring Zafar Debrosse: Lois Huxley Treating [name]/Extender: [name], [name]: 4 Encounter Discharge Information Items Discharge Pain Level: 0 Discharge  Condition: Stable Ambulatory Status: Wheelchair Discharge Destination: Nursing Home Transportation: Private Auto Accompanied By: caregiver Schedule Follow-up Appointment: Yes Medication Reconciliation completed and No provided to Patient/Care [name]: Provided on Clinical Summary of Care: 10/18/2017 Form Type Recipient Paper Patient SM Electronic Signature(s) Signed: 10/18/2017 3:21:44 PM By: Alejandro Mulling Entered By: Alejandro Mulling on 10/18/2017 15:21:43 [name], [name] ([id number]) -------------------------------------------------------------------------------- Lower Extremity Assessment Details Patient Name: [name] Date of Service: 10/18/2017 2:15 PM Medical Record Number: [id number] Patient Account Number: [id number] Date of Birth/Sex: 1928-09-05 (84 y.o. F) Treating RN: Huel Coventry Primary Care Montarius Kitagawa: Lois Huxley Other Clinician: Referring [name]: Lois Huxley Treating [name]/Extender: Skeet Simmer in [name]: 4 Electronic Signature(s) Signed: 10/23/2017 12:27:24 PM By: Elliot Gurney, BSN, RN, CWS, Kim RN, BSN Entered By: Elliot Gurney, BSN, RN, CWS, Kim on 10/18/2017 14:50:59 [name] ([id number]) -------------------------------------------------------------------------------- Multi Wound Chart Details Patient Name: [name], [name] Date of Service: 10/18/2017 2:15 PM Medical Record Number: [id number] Patient Account Number: [id number] Date of Birth/Sex: 1928-09-04 (84 y.o. F) Treating RN: Huel Coventry Primary Care Varie Machamer: Lois Huxley Other Clinician: Referring [name]: Lois Huxley Treating [name]/Extender: [name], [name]: 4 Vital Signs Height(in): Pulse(bpm): 80 Weight(lbs): Blood Pressure(mmHg): 112/61 Body Mass Index(BMI): Temperature(F): 97.7 Respiratory Rate 18 (breaths/min): Photos: [1:No Photos] [N/A:N/A] Wound Location: [1:Sacrum] [N/A:N/A] Wounding Event: [1:Pressure Injury] [N/A:N/A] Primary Etiology:  [1:Pressure Ulcer] [N/A:N/A] Comorbid History: [1:Cataracts, Anemia, Hypertension, Type II Diabetes, Rheumatoid Arthritis, Osteoarthritis, Received Chemotherapy, Received Radiation] [N/A:N/A] Date Acquired: [1:07/08/2017] [N/A:N/A] Weeks of [name]: [1:4] [N/A:N/A] Wound Status: [1:Open] [N/A:N/A] Measurements L x W x D [1:4.5x4.5x1.3] [N/A:N/A] (cm) Area (cm) : [1:15.904] [N/A:N/A] Volume (cm) : [1:20.676] [N/A:N/A] % Reduction in Area: [1:-18.40%] [N/A:N/A] % Reduction in Volume: [1:-10.00%] [N/A:N/A] Starting Position 1 [1:12] (o'clock): Ending Position 1 [1:12] (o'clock): Maximum Distance 1 (cm): [1:2.6] Undermining: [1:Yes] [N/A:N/A] Classification: [1:Category/Stage IV] [N/A:N/A] Exudate Amount: [1:Large] [N/A:N/A] Exudate Type: [1:Purulent] [N/A:N/A] Exudate Color: [1:yellow, brown, green] [N/A:N/A] Foul Odor After Cleansing: [1:Yes] [N/A:N/A] Odor Anticipated Due  to [1:No] [N/A:N/A] Product Use: Wound Margin: [1:Flat and Intact] [N/A:N/A] Granulation Amount: [1:Medium (34-66%)] [N/A:N/A] Granulation Quality: [1:Pink] [N/A:N/A] Necrotic Amount: [1:Medium (34-66%)] [N/A:N/A] Necrotic Tissue: [1:Eschar, Adherent Slough] [N/A:N/A] Exposed Structures: Fat Layer (Subcutaneous N/A N/A Tissue) Exposed: Yes Bone: Yes Fascia: No Tendon: No Muscle: No Joint: No Epithelialization: None N/A N/A Periwound Skin Texture: Excoriation: No N/A N/A Induration: No Callus: No Crepitus: No Rash: No Scarring: No Periwound Skin Moisture: Maceration: No N/A N/A Dry/Scaly: No Periwound Skin Color: Atrophie Blanche: No N/A N/A Cyanosis: No Ecchymosis: No Erythema: No Hemosiderin Staining: No Mottled: No Pallor: No Rubor: No Temperature: No Abnormality N/A N/A Tenderness on Palpation: Yes N/A N/A Wound Preparation: Ulcer Cleansing: N/A N/A Rinsed/Irrigated with Saline Topical Anesthetic Applied: None, Other: hurricaine spray [name] Notes Electronic  Signature(s) Signed: 10/23/2017 12:27:24 PM By: Elliot Gurney, BSN, RN, CWS, Kim RN, BSN Entered By: Elliot Gurney, BSN, RN, CWS, Kim on 10/18/2017 14:51:20 [name] ([id number]) -------------------------------------------------------------------------------- Multi-Disciplinary Care Plan Details Patient Name: [name], [name]. Date of Service: 10/18/2017 2:15 PM Medical Record Number: [id number] Patient Account Number: [id number] Date of Birth/Sex: [birth year] (84 y.o. F) Treating RN: Huel Coventry Primary Care Arwilda [name]: Lois Huxley Other Clinician: Referring Amine Adelson: Lois Huxley Treating Emanuel Dowson/Extender: [name], [name]: 4 Active Inactive ` Abuse / Safety / Falls / Self Care Management Nursing Diagnoses: Potential for falls Goals: Patient will not experience any injury related to falls Date Initiated: 09/19/2017 Target Resolution Date: 01/04/2018 Goal Status: Active Interventions: Assess Activities of Daily Living upon admission and as needed Assess fall risk on admission and as needed Assess: immobility, friction, shearing, incontinence upon admission and as needed Assess impairment of mobility on admission and as needed per policy Notes: ` Nutrition Nursing Diagnoses: Imbalanced nutrition Potential for alteratiion in Nutrition/Potential for imbalanced nutrition Goals: Patient/caregiver agrees to and verbalizes understanding of need to use nutritional supplements and/or vitamins as prescribed Date Initiated: 09/19/2017 Target Resolution Date: 01/04/2018 Goal Status: Active Interventions: Assess patient nutrition upon admission and as needed per policy Notes: ` Orientation to the Wound Care Program Nursing Diagnoses: Knowledge deficit related to the wound healing center program Goals: Patient/caregiver will verbalize understanding of the Wound Healing Center Program [place], [name] ([id number]) Date Initiated: 09/19/2017 Target Resolution Date: 10/05/2017 Goal  Status: Active Interventions: Provide education on orientation to the wound center Notes: ` Pain, Acute or Chronic Nursing Diagnoses: Pain, acute or chronic: actual or potential Potential alteration in comfort, pain Goals: Patient/caregiver will verbalize adequate pain control between visits Date Initiated: 09/19/2017 Target Resolution Date: 01/04/2018 Goal Status: Active Interventions: Complete pain assessment as per visit requirements Encourage patient to take pain medications as prescribed Notes: ` Wound/Skin Impairment Nursing Diagnoses: Impaired tissue integrity Knowledge deficit related to ulceration/compromised skin integrity Goals: Ulcer/skin breakdown will have a volume reduction of 80% by week 12 Date Initiated: 09/19/2017 Target Resolution Date: 01/04/2018 Goal Status: Active Interventions: Assess patient/caregiver ability to perform ulcer/skin care regimen upon admission and as needed Assess ulceration(s) every visit Notes: Electronic Signature(s) Signed: 10/23/2017 12:27:24 PM By: Elliot Gurney, BSN, RN, CWS, Kim RN, BSN Entered By: Elliot Gurney, BSN, RN, CWS, Kim on 10/18/2017 14:51:05 [place], [name] ([id number]) -------------------------------------------------------------------------------- Pain Assessment Details Patient Name: [name], [name]. Date of Service: 10/18/2017 2:15 PM Medical Record Number: [id number] Patient Account Number: [id number] Date of Birth/Sex: Sep 04, 1928 (84 y.o. F) Treating RN: Renne Crigler Primary Care Yuriy Cui: Lois Huxley Other Clinician: Referring Evelette Hollern: Lois Huxley Treating Clarnce Homan/Extender: STONE III, [name]: 4 Active Problems Location of  Pain Severity and Description of Pain Patient Has Paino Yes Site Locations Pain Location: Generalized Pain Rate the pain. Current Pain Level: 4 Pain Management and Medication Current Pain Management: Notes had physical therapy this morning as states this causes her soreness in  her legs and feet Electronic Signature(s) Signed: 10/21/2017 4:40:07 PM By: Renne Crigler Entered By: Renne Crigler on 10/18/2017 14:15:36 Duzan, [name] ([id number]) -------------------------------------------------------------------------------- Patient/Caregiver Education Details Patient Name: [name]. Date of Service: 10/18/2017 2:15 PM Medical Record Number: [id number] Patient Account Number: [id number] Date of Birth/Gender: [birth year] (84 y.o. F) Treating RN: Phillis Haggis Primary Care Physician: Lois Huxley Other Clinician: Referring Physician: Lois Huxley Treating Physician/Extender: Skeet Simmer in [name]: 4 Education Assessment Education Provided To: Patient Education Topics Provided Wound/Skin Impairment: Handouts: Caring for Your Ulcer, Other: change dressing as ordered Methods: Demonstration, Explain/Verbal Responses: State content correctly Electronic Signature(s) Signed: 10/18/2017 4:41:07 PM By: Alejandro Mulling Entered By: Alejandro Mulling on 10/18/2017 15:22:25 Wonders, [name] ([id number]) -------------------------------------------------------------------------------- Wound Assessment Details Patient Name: [name]. Date of Service: 10/18/2017 2:15 PM Medical Record Number: [id number] Patient Account Number: [id number] Date of Birth/Sex: March 20, 1929 (84 y.o. F) Treating RN: Renne Crigler Primary Care Icyss Skog: Lois Huxley Other Clinician: Referring Donovan Persley: Lois Huxley Treating Leisl Spurrier/Extender: [name], [name]: 4 Wound Status Wound Number: 1 Primary Pressure Ulcer Etiology: Wound Location: Sacrum Wound Open Wounding Event: Pressure Injury Status: Date Acquired: 07/08/2017 Comorbid Cataracts, Anemia, Hypertension, Type II Weeks Of [name]: 4 History: Diabetes, Rheumatoid Arthritis, Osteoarthritis, Clustered Wound: No Received Chemotherapy, Received Radiation Photos Photo Uploaded By:  Renne Crigler on 10/18/2017 16:04:45 Wound Measurements Length: (cm) 4.5 Width: (cm) 4.5 Depth: (cm) 1.3 Area: (cm) 15.904 Volume: (cm) 20.676 % Reduction in Area: -18.4% % Reduction in Volume: -10% Epithelialization: None Tunneling: No Undermining: Yes Starting Position (o'clock): 12 Ending Position (o'clock): 12 Maximum Distance: (cm) 2.6 Wound Description Classification: Category/Stage IV Wound Margin: Flat and Intact Exudate Amount: Large Exudate Type: Purulent Exudate Color: yellow, brown, green Foul Odor After Cleansing: Yes Due to Product Use: No Slough/Fibrino No Wound Bed Granulation Amount: Medium (34-66%) Exposed Structure Granulation Quality: Pink Fascia Exposed: No Necrotic Amount: Medium (34-66%) Fat Layer (Subcutaneous Tissue) Exposed: Yes Necrotic Quality: Eschar, Adherent Slough Tendon Exposed: No Muscle Exposed: No [name], [name] J. ([id number]) Joint Exposed: No Bone Exposed: Yes Periwound Skin Texture Texture Color No Abnormalities Noted: No No Abnormalities Noted: No Callus: No Atrophie Blanche: No Crepitus: No Cyanosis: No Excoriation: No Ecchymosis: No Induration: No Erythema: No Rash: No Hemosiderin Staining: No Scarring: No Mottled: No Pallor: No Moisture Rubor: No No Abnormalities Noted: No Dry / Scaly: No Temperature / Pain Maceration: No Temperature: No Abnormality Tenderness on Palpation: Yes Wound Preparation Ulcer Cleansing: Rinsed/Irrigated with Saline Topical Anesthetic Applied: None, Other: hurricaine spray, Electronic Signature(s) Signed: 10/21/2017 4:40:07 PM By: Renne Crigler Entered By: Renne Crigler on 10/18/2017 14:26:14 Polanco, [name] ([id number]) -------------------------------------------------------------------------------- Vitals Details Patient Name: [name]. Date of Service: 10/18/2017 2:15 PM Medical Record Number: [id number] Patient Account Number: [id number] Date of Birth/Sex:  February 13, 1929 (84 y.o. F) Treating RN: Renne Crigler Primary Care Damondre Pfeifle: Lois Huxley Other Clinician: Referring Milena Liggett: Lois Huxley Treating Aston Lieske/Extender: [name], [name]: 4 Vital Signs Time Taken: 14:15 Temperature (F): 97.7 Pulse (bpm): 80 Respiratory Rate (breaths/min): 18 Blood Pressure (mmHg): 112/61 Reference Range: 80 - 120 mg / dl Electronic Signature(s) Signed: 10/21/2017 4:40:07 PM By: Renne Crigler Entered By: Renne Crigler on 10/18/2017 14:15:57

## 2017-10-26 NOTE — Progress Notes (Signed)
JADIA, CAPERS ([id number]) Visit Report for 10/18/2017 Chief Complaint Document Details Patient Name: [name], [name]. Date of Service: 10/18/2017 2:15 PM Medical Record Number: [id number] Patient Account Number: [id number] Date of Birth/Sex: 1928/10/15 (84 y.o. F) Treating RN: Curtis Sites Primary Care Provider: Lois Huxley Other Clinician: Referring Provider: Lois Huxley Treating Provider/Extender: Linwood Dibbles, HOYT Weeks in Treatment: 4 Information Obtained from: Patient Chief Complaint She is here for evaluation of a sacrococcygeal pressure ulcer Electronic Signature(s) Signed: 10/18/2017 5:53:05 PM By: Lenda Kelp PA-C Entered By: Lenda Kelp on 10/18/2017 14:47:02 Allebach, [name] ([id number]) -------------------------------------------------------------------------------- Debridement Details Patient Name: [name]. Date of Service: 10/18/2017 2:15 PM Medical Record Number: [id number] Patient Account Number: [id number] Date of Birth/Sex: 05-12-1929 (84 y.o. F) Treating RN: Huel Coventry Primary Care Provider: Lois Huxley Other Clinician: Referring Provider: Lois Huxley Treating Provider/Extender: Linwood Dibbles, HOYT Weeks in Treatment: 4 Debridement Performed for Wound #1 Sacrum Assessment: Performed By: Physician STONE III, HOYT E., PA-C Debridement Type: Debridement Pre-procedure Verification/Time Yes - 14:49 Out Taken: Start Time: 14:49 Pain Control: Other : benzocaine Total Area Debrided (L x W): 4.5 (cm) x 2 (cm) = 9 (cm) Tissue and other material Viable, Slough, Subcutaneous, Slough debrided: Level: Skin/Subcutaneous Tissue Debridement Description: Excisional Instrument: Curette Bleeding: Moderate Hemostasis Achieved: Pressure End Time: 14:52 Procedural Pain: 0 Post Procedural Pain: 0 Response to Treatment: Procedure was tolerated well Post Debridement Measurements of Total Wound Length: (cm) 4.5 Stage: Category/Stage IV Width: (cm)  4.5 Depth: (cm) 1.3 Volume: (cm) 20.676 Character of Wound/Ulcer Post Requires Further Debridement Debridement: Post Procedure Diagnosis Same as Pre-procedure Electronic Signature(s) Signed: 10/18/2017 5:53:05 PM By: Lenda Kelp PA-C Signed: 10/23/2017 12:27:24 PM By: Elliot Gurney, BSN, RN, CWS, Kim RN, BSN Entered By: Elliot Gurney, BSN, RN, CWS, Kim on 10/18/2017 14:54:38 Woodring, [name] ([id number]) -------------------------------------------------------------------------------- HPI Details Patient Name: [name], [name]. Date of Service: 10/18/2017 2:15 PM Medical Record Number: [id number] Patient Account Number: [id number] Date of Birth/Sex: 1928-07-22 (84 y.o. F) Treating RN: Curtis Sites Primary Care Provider: Lois Huxley Other Clinician: Referring Provider: Lois Huxley Treating Provider/Extender: Linwood Dibbles, HOYT Weeks in Treatment: 4 History of Present Illness HPI Description: 09/19/17-She is here for initial evaluation of the sacrococcygeal pressure ulcer. She states she was hospitalized for 1 month December-January in [place]; she was out of town for her brother's funeral at the time. While hospitalized she developed a sacral ulcer and was discharged to [facility] and [place]. She is nonambulatory, Hoyer lift dependent. She states she is repositioned while in bed but spends approximately 5-8 hours a day in the wheelchair. She does admit to pain with sitting. She denies being on any antibiotic therapy or any specific evaluation for her sacral ulcer since arriving to rehabilitation. She has a listed allergy to lidocaine, but states this was a combination medication of Aspercreme with lidocaine that she had a reaction to. She tolerated debridement of nonviable tissue today, there is no overt sign of infection and I expect a swab culture would produce a polymicrobial result which may not reflect an acute infection. We will treat with Dakin's wet-to-dry daily and she will  follow-up next week. 09/26/17-She is here for evaluation for stage IV sacral pressure ulcer. Mobile x-ray was performed at her facility and the conclusion reads findings suspicious for osteomyelitis involving the inferior sacrum; we will initiate her on doxycycline and refer her to infectious disease for their input. She had a pre-albumin drawn, level 7; referral for facility dietitian to  re-evaluate. There is improvement in amount of non-viable tissue, granulation tissue present; will continue with same treatment plan and follow up next week. 10/04/17 on evaluation today patient presents for follow-up concerning her sacral ulcer. She continues to experience discomfort although we did actually attempt been the cane spray today after discussing this with patient and her son and she seems to have tolerated this very well without any complication. There was no evidence erythema, the pain was improved, and in general I was pleased with this fact. The patient continues to be on doxycycline at this point she is going to be seeing that to [facility] we're not exactly sure when that appointment is we will check on that today. Otherwise the patient seems to be tolerating the dressing changes fairly well her son does state she had a difficult ride here today she does have a new Roho cushion he's unsure if this somehow has affected things and made it worse. 10/18/17 on evaluation today patient appears to be doing rather well in regard to her sacral wound. She has been tolerating the dressing changes without complication. With that being said she has no evidence of infection there still some Slough noted currently on the surface of the wound special at the 12 o'clock location which did require sharp debridement today. She tolerated this without complication at this point. Overall I am happy with how things seem to be progressing. Electronic Signature(s) Signed: 10/18/2017 5:53:05 PM By: Lenda Kelp  PA-C Entered By: Lenda Kelp on 10/18/2017 15:11:18 Watt, [name] ([id number]) -------------------------------------------------------------------------------- Physical Exam Details Patient Name: KAMYRIA, HOKETT Date of Service: 10/18/2017 2:15 PM Medical Record Number: [id number] Patient Account Number: [id number] Date of Birth/Sex: 09-06-1928 (84 y.o. F) Treating RN: Curtis Sites Primary Care Provider: Lois Huxley Other Clinician: Referring Provider: Lois Huxley Treating Provider/Extender: STONE III, HOYT Weeks in Treatment: 4 Constitutional Well-nourished and well-hydrated in no acute distress. Respiratory normal breathing without difficulty. Psychiatric this patient is able to make decisions and demonstrates good insight into disease process. Alert and Oriented x 3. pleasant and cooperative. Notes Patient's wound at this point does show evidence of slough noted especially in the 12 o'clock location the wound which was sharply debrided today with good result. Post debridement the wound bed seems to be cleaner overall I feel like this is progressing nicely. The patient seems to be doing very well. Electronic Signature(s) Signed: 10/18/2017 5:53:05 PM By: Lenda Kelp PA-C Entered By: Lenda Kelp on 10/18/2017 15:12:27 [name] ([id number]) -------------------------------------------------------------------------------- Physician Orders Details Patient Name: ALBERTEEN, SCHUPP Date of Service: 10/18/2017 2:15 PM Medical Record Number: [id number] Patient Account Number: [id number] Date of Birth/Sex: [birth year] (84 y.o. F) Treating RN: Huel Coventry Primary Care Provider: Lois Huxley Other Clinician: Referring Provider: Lois Huxley Treating Provider/Extender: Linwood Dibbles, HOYT Weeks in Treatment: 4 Verbal / Phone Orders: No Diagnosis Coding ICD-10 Coding Code Description L89.154 Pressure ulcer of sacral region, stage 4 Z99.3 Dependence on wheelchair R54  Age-related physical debility M46.28 Osteomyelitis of vertebra, sacral and sacrococcygeal region E43 Unspecified severe protein-calorie malnutrition Wound Cleansing Wound #1 Sacrum o Clean wound with Normal Saline. Anesthetic (add to Medication List) Wound #1 Sacrum o Topical Lidocaine 4% cream applied to wound bed prior to debridement (In Clinic Only). Skin Barriers/Peri-Wound Care Wound #1 Sacrum o Skin Prep Primary Wound Dressing Wound #1 Sacrum o Other: - Dakin's Solution moisten gauze Secondary Dressing Wound #1 Sacrum o Dry Gauze o Boardered Foam Dressing Dressing Change Frequency Wound #1  Sacrum o Change dressing every day. Follow-up Appointments Wound #1 Sacrum o Return Appointment in 1 week. Off-Loading Wound #1 Sacrum o Mattress - Group II Dimercurio, Takeela J. ([id number]) o Turn and reposition every 2 hours Additional Orders / Instructions Wound #1 Sacrum o Increase protein intake. o Other: - Vitamin C, Zinc Dietitian Evaluation for protein intake adjustments Electronic Signature(s) Signed: 10/18/2017 5:53:05 PM By: Lenda Kelp PA-C Signed: 10/23/2017 12:27:24 PM By: Elliot Gurney, BSN, RN, CWS, Kim RN, BSN Entered By: Elliot Gurney, BSN, RN, CWS, Kim on 10/18/2017 14:53:58 Harnois, [name] ([id number]) -------------------------------------------------------------------------------- Problem List Details Patient Name: KAVITA, SARGE. Date of Service: 10/18/2017 2:15 PM Medical Record Number: [id number] Patient Account Number: [id number] Date of Birth/Sex: 29-Mar-1929 (84 y.o. F) Treating RN: Curtis Sites Primary Care Provider: Lois Huxley Other Clinician: Referring Provider: Lois Huxley Treating Provider/Extender: Linwood Dibbles, HOYT Weeks in Treatment: 4 Active Problems ICD-10 Impacting Encounter Code Description Active Date Wound Healing Diagnosis L89.154 Pressure ulcer of sacral region, stage 4 09/19/2017 Yes Z99.3 Dependence on wheelchair  09/19/2017 Yes R54 Age-related physical debility 09/19/2017 Yes M46.28 Osteomyelitis of vertebra, sacral and sacrococcygeal region 09/26/2017 Yes E43 Unspecified severe protein-calorie malnutrition 09/26/2017 Yes Inactive Problems Resolved Problems Electronic Signature(s) Signed: 10/18/2017 5:53:05 PM By: Lenda Kelp PA-C Entered By: Lenda Kelp on 10/18/2017 14:44:18 Loden, [name] ([id number]) -------------------------------------------------------------------------------- Progress Note Details Patient Name: [name]. Date of Service: 10/18/2017 2:15 PM Medical Record Number: [id number] Patient Account Number: [id number] Date of Birth/Sex: 03/28/1929 (84 y.o. F) Treating RN: Curtis Sites Primary Care Provider: Lois Huxley Other Clinician: Referring Provider: Lois Huxley Treating Provider/Extender: Linwood Dibbles, HOYT Weeks in Treatment: 4 Subjective Chief Complaint Information obtained from Patient She is here for evaluation of a sacrococcygeal pressure ulcer History of Present Illness (HPI) 09/19/17-She is here for initial evaluation of the sacrococcygeal pressure ulcer. She states she was hospitalized for 1 month December-January in [place]; she was out of town for her brother's funeral at the time. While hospitalized she developed a sacral ulcer and was discharged to [facility] and [place]. She is nonambulatory, Hoyer lift dependent. She states she is repositioned while in bed but spends approximately 5-8 hours a day in the wheelchair. She does admit to pain with sitting. She denies being on any antibiotic therapy or any specific evaluation for her sacral ulcer since arriving to rehabilitation. She has a listed allergy to lidocaine, but states this was a combination medication of Aspercreme with lidocaine that she had a reaction to. She tolerated debridement of nonviable tissue today, there is no overt sign of infection and I expect a swab culture would  produce a polymicrobial result which may not reflect an acute infection. We will treat with Dakin's wet-to-dry daily and she will follow-up next week. 09/26/17-She is here for evaluation for stage IV sacral pressure ulcer. Mobile x-ray was performed at her facility and the conclusion reads findings suspicious for osteomyelitis involving the inferior sacrum; we will initiate her on doxycycline and refer her to infectious disease for their input. She had a pre-albumin drawn, level 7; referral for facility dietitian to re-evaluate. There is improvement in amount of non-viable tissue, granulation tissue present; will continue with same treatment plan and follow up next week. 10/04/17 on evaluation today patient presents for follow-up concerning her sacral ulcer. She continues to experience discomfort although we did actually attempt been the cane spray today after discussing this with patient and her son and she seems to have tolerated this very  well without any complication. There was no evidence erythema, the pain was improved, and in general I was pleased with this fact. The patient continues to be on doxycycline at this point she is going to be seeing that to [facility] we're not exactly sure when that appointment is we will check on that today. Otherwise the patient seems to be tolerating the dressing changes fairly well her son does state she had a difficult ride here today she does have a new Roho cushion he's unsure if this somehow has affected things and made it worse. 10/18/17 on evaluation today patient appears to be doing rather well in regard to her sacral wound. She has been tolerating the dressing changes without complication. With that being said she has no evidence of infection there still some Slough noted currently on the surface of the wound special at the 12 o'clock location which did require sharp debridement today. She tolerated this without complication at this point. Overall I am  happy with how things seem to be progressing. Patient History Information obtained from Patient. Family History Cancer - Child, No family history of Diabetes, Heart Disease, Hereditary Spherocytosis, Hypertension, Kidney Disease, Lung Disease, Seizures, Stroke, Thyroid Problems, Tuberculosis. Social History Never smoker, Marital Status - Widowed, Alcohol Use - Never, Drug Use - No History, Caffeine Use - Rarely. Medical And Surgical History Notes LAINEE, LEHRMAN ([id number]) Cardiovascular hyperlipidemia Gastrointestinal hernia repair December 2018, colostomy Oncologic rectal cancer 20 years ago Review of Systems (ROS) Constitutional Symptoms (General Health) Denies complaints or symptoms of Fever, Chills. Respiratory The patient has no complaints or symptoms. Cardiovascular The patient has no complaints or symptoms. Psychiatric The patient has no complaints or symptoms. Objective Constitutional Well-nourished and well-hydrated in no acute distress. Vitals Time Taken: 2:15 PM, Temperature: 97.7 F, Pulse: 80 bpm, Respiratory Rate: 18 breaths/min, Blood Pressure: 112/61 mmHg. Respiratory normal breathing without difficulty. Psychiatric this patient is able to make decisions and demonstrates good insight into disease process. Alert and Oriented x 3. pleasant and cooperative. General Notes: Patient's wound at this point does show evidence of slough noted especially in the 12 o'clock location the wound which was sharply debrided today with good result. Post debridement the wound bed seems to be cleaner overall I feel like this is progressing nicely. The patient seems to be doing very well. Integumentary (Hair, Skin) Wound #1 status is Open. Original cause of wound was Pressure Injury. The wound is located on the Sacrum. The wound measures 4.5cm length x 4.5cm width x 1.3cm depth; 15.904cm^2 area and 20.676cm^3 volume. There is bone and Fat Layer (Subcutaneous Tissue) Exposed  exposed. There is no tunneling noted, however, there is undermining starting at 12:00 and ending at 12:00 with a maximum distance of 2.6cm. There is a large amount of purulent drainage noted. Foul odor after cleansing was noted. The wound margin is flat and intact. There is medium (34-66%) pink granulation within the wound bed. There is a medium (34-66%) amount of necrotic tissue within the wound bed including Eschar and Adherent Slough. The periwound skin appearance did not exhibit: Callus, Crepitus, Excoriation, Induration, Rash, Scarring, Dry/Scaly, Maceration, Atrophie Blanche, Cyanosis, Ecchymosis, Hemosiderin Staining, Mottled, Pallor, Rubor, Erythema. Periwound temperature was noted as No Abnormality. The periwound has tenderness on palpation. Dunsmuir, Darolyn JMarland Kitchen ([id number]) Assessment Active Problems ICD-10 L89.154 - Pressure ulcer of sacral region, stage 4 Z99.3 - Dependence on wheelchair R54 - Age-related physical debility M46.28 - Osteomyelitis of vertebra, sacral and sacrococcygeal region E43 - Unspecified severe protein-calorie  malnutrition Procedures Wound #1 Pre-procedure diagnosis of Wound #1 is a Pressure Ulcer located on the Sacrum . There was a Excisional Skin/Subcutaneous Tissue Debridement with a total area of 9 sq cm performed by STONE III, HOYT E., PA-C. With the following instrument(s): Curette. to remove Viable tissue/material Material removed includes Subcutaneous Tissue, and Slough, and [place] after achieving pain control using Other (benzocaine). No specimens were taken. A time out was conducted at 14:49, prior to the start of the procedure. A Moderate amount of bleeding was controlled with Pressure. The procedure was tolerated well with a pain level of 0 throughout and a pain level of 0 following the procedure. Post Debridement Measurements: 4.5cm length x 4.5cm width x 1.3cm depth; 20.676cm^3 volume. Post debridement Stage noted as Category/Stage IV. Character of  Wound/Ulcer Post Debridement requires further debridement. Post procedure Diagnosis Wound #1: Same as Pre-Procedure Plan Wound Cleansing: Wound #1 Sacrum: Clean wound with Normal Saline. Anesthetic (add to Medication List): Wound #1 Sacrum: Topical Lidocaine 4% cream applied to wound bed prior to debridement (In Clinic Only). Skin Barriers/Peri-Wound Care: Wound #1 Sacrum: Skin Prep Primary Wound Dressing: Wound #1 Sacrum: Other: - Dakin's Solution moisten gauze Secondary Dressing: Wound #1 Sacrum: Dry Gauze Boardered Foam Dressing Dressing Change Frequency: Wound #1 Sacrum: Change dressing every day. SAHAR, RYBACK ([id number]) Follow-up Appointments: Wound #1 Sacrum: Return Appointment in 1 week. Off-Loading: Wound #1 Sacrum: Mattress - Group II Turn and reposition every 2 hours Additional Orders / Instructions: Wound #1 Sacrum: Increase protein intake. Other: - Vitamin C, Zinc Dietitian Evaluation for protein intake adjustments At this time patient seems to be doing well and I'm gonna recommend that we continue with the Current wound care measures. She's in agreement with this plan. Please see above for specific wound care orders. We will see patient for re-evaluation in 1 week(s) here in the clinic. If anything worsens or changes patient will contact our office for additional recommendations. Electronic Signature(s) Signed: 10/18/2017 5:53:05 PM By: Lenda Kelp PA-C Entered By: Lenda Kelp on 10/18/2017 15:15:39 Canevari, [name] ([id number]) -------------------------------------------------------------------------------- ROS/PFSH Details Patient Name: AMYIAH, GABA Date of Service: 10/18/2017 2:15 PM Medical Record Number: [id number] Patient Account Number: [id number] Date of Birth/Sex: 12-18-1928 (84 y.o. F) Treating RN: Curtis Sites Primary Care Provider: Lois Huxley Other Clinician: Referring Provider: Lois Huxley Treating Provider/Extender: Linwood Dibbles, HOYT Weeks in Treatment: 4 Information Obtained From Patient Wound History Do you currently have one or more open woundso Yes How many open wounds do you currently haveo 1 Approximately how long have you had your woundso since December How have you been treating your wound(s) until nowo dakins Has your wound(s) ever healed and then re-openedo No Have you had any lab work done in the past montho No Have you tested positive for an antibiotic resistant organism (MRSA, VRE)o No Have you tested positive for osteomyelitis (bone infection)o No Have you had any tests for circulation on your legso No Constitutional Symptoms (General Health) Complaints and Symptoms: Negative for: Fever; Chills Eyes Medical History: Positive for: Cataracts - removed Hematologic/Lymphatic Medical History: Positive for: Anemia Respiratory Complaints and Symptoms: No Complaints or Symptoms Medical History: Negative for: Aspiration; Asthma; Chronic Obstructive Pulmonary Disease (COPD); Pneumothorax; Sleep Apnea; Tuberculosis Cardiovascular Complaints and Symptoms: No Complaints or Symptoms Medical History: Positive for: Hypertension Negative for: Angina; Arrhythmia; Congestive Heart Failure; Coronary Artery Disease; Deep Vein Thrombosis; Hypotension; Myocardial Infarction; Peripheral Arterial Disease; Peripheral Venous Disease; Phlebitis; Vasculitis Past Medical History Notes: hyperlipidemia Weitz,  [name] ([id number]) Gastrointestinal Medical History: Negative for: Cirrhosis ; Colitis; Crohnos; Hepatitis A; Hepatitis B; Hepatitis C Past Medical History Notes: hernia repair December 2018, colostomy Endocrine Medical History: Positive for: Type II Diabetes Immunological Medical History: Negative for: Lupus Erythematosus; Raynaudos Integumentary (Skin) Medical History: Negative for: History of Burn; History of pressure wounds Musculoskeletal Medical History: Positive for: Rheumatoid  Arthritis; Osteoarthritis Neurologic Medical History: Negative for: Dementia; Neuropathy Oncologic Medical History: Positive for: Received Chemotherapy; Received Radiation Past Medical History Notes: rectal cancer 20 years ago Psychiatric Complaints and Symptoms: No Complaints or Symptoms HBO Extended History Items Eyes: Cataracts Immunizations Pneumococcal Vaccine: Received Pneumococcal Vaccination: Yes Immunization Notes: up to date Implantable Devices Family and Social History Cancer: Yes - Child; Diabetes: No; Heart Disease: No; Hereditary Spherocytosis: No; Hypertension: No; Kidney Disease: No; Lung Disease: No; Seizures: No; Stroke: No; Thyroid Problems: No; Tuberculosis: No; Never smoker; Marital Status - WILE, Latonyia J. ([id number]) Widowed; Alcohol Use: Never; Drug Use: No History; Caffeine Use: Rarely; Financial Concerns: No; Food, Clothing or Shelter Needs: No; Support System Lacking: No; Transportation Concerns: No; Advanced Directives: No; Patient does not want information on Advanced Directives Physician Affirmation I have reviewed and agree with the above information. Electronic Signature(s) Signed: 10/18/2017 5:53:05 PM By: Lenda Kelp PA-C Signed: 10/22/2017 4:18:46 PM By: Curtis Sites Entered By: Lenda Kelp on 10/18/2017 15:11:39 Weigand, [name] ([id number]) -------------------------------------------------------------------------------- SuperBill Details Patient Name: LARIYA, KINZIE Date of Service: 10/18/2017 Medical Record Number: [id number] Patient Account Number: [id number] Date of Birth/Sex: 1929-06-10 (84 y.o. F) Treating RN: Curtis Sites Primary Care Provider: Lois Huxley Other Clinician: Referring Provider: Lois Huxley Treating Provider/Extender: Linwood Dibbles, HOYT Weeks in Treatment: 4 Diagnosis Coding ICD-10 Codes Code Description L89.154 Pressure ulcer of sacral region, stage 4 Z99.3 Dependence on wheelchair R54 Age-related  physical debility M46.28 Osteomyelitis of vertebra, sacral and sacrococcygeal region E43 Unspecified severe protein-calorie malnutrition Facility Procedures CPT4 Code: [id number] Description: 11042 - DEB SUBQ TISSUE 20 SQ CM/< ICD-10 Diagnosis Description L89.154 Pressure ulcer of sacral region, stage 4 Modifier: Quantity: 1 Physician Procedures CPT4 Code: [id number] Description: 11042 - WC PHYS SUBQ TISS 20 SQ CM ICD-10 Diagnosis Description L89.154 Pressure ulcer of sacral region, stage 4 Modifier: Quantity: 1 Electronic Signature(s) Signed: 10/18/2017 5:53:05 PM By: Lenda Kelp PA-C Entered By: Lenda Kelp on 10/18/2017 15:15:56

## 2017-10-29 NOTE — Progress Notes (Signed)
[name], [name] ([id number]) Visit Report for 10/25/2017 Arrival Information Details Patient Name: [name], [name]. Date of Service: 10/25/2017 10:15 AM Medical Record Number: [id number] Patient Account Number: [id number] Date of Birth/Sex: [birth year] (84 y.o. F) Treating RN: Renne Crigler Primary Care Itay Mella: Lois Huxley Other Clinician: Referring Rochelle Nephew: Lois Huxley Treating Gesenia Bantz/Extender: Linwood Dibbles, HOYT Weeks in Treatment: 5 Visit Information History Since Last Visit All ordered tests and consults were completed: No Patient Arrived: Wheel Chair Added or deleted any medications: No Arrival Time: 10:51 Any new allergies or adverse reactions: No Accompanied By: caregiver Had a fall or experienced change in No Transfer Assistance: None activities of daily living that may affect Patient Identification Verified: Yes risk of falls: Secondary Verification Process Completed: Yes Signs or symptoms of abuse/neglect since last visito No Patient Has Alerts: Yes Hospitalized since last visit: No Patient Alerts: DMII Implantable device outside of the clinic excluding No NO LIDOCAINE cellular tissue based products placed in the center since last visit: Pain Present Now: Yes Electronic Signature(s) Signed: 10/25/2017 4:07:12 PM By: Renne Crigler Entered By: Renne Crigler on 10/25/2017 10:52:14 [name] ([id number]) -------------------------------------------------------------------------------- Encounter Discharge Information Details Patient Name: [name], [name]. Date of Service: 10/25/2017 10:15 AM Medical Record Number: [id number] Patient Account Number: [id number] Date of Birth/Sex: 04/17/1929 (84 y.o. F) Treating RN: Phillis Haggis Primary Care Emberlie Gotcher: Lois Huxley Other Clinician: Referring Samona Chihuahua: Lois Huxley Treating Lesslie Mckeehan/Extender: Linwood Dibbles, HOYT Weeks in Treatment: 5 Encounter Discharge Information Items Discharge Pain Level: 0 Discharge  Condition: Stable Ambulatory Status: Wheelchair Discharge Destination: Home Private Transportation: Auto Accompanied By: self Schedule Follow-up Appointment: Yes Medication Reconciliation completed and provided No to Patient/Care Dontasia Miranda: Clinical Summary of Care: Electronic Signature(s) Signed: 10/25/2017 11:49:44 AM By: Alejandro Mulling Entered By: Alejandro Mulling on 10/25/2017 11:49:44 [place], [name] ([id number]) -------------------------------------------------------------------------------- Lower Extremity Assessment Details Patient Name: [name] Date of Service: 10/25/2017 10:15 AM Medical Record Number: [id number] Patient Account Number: [id number] Date of Birth/Sex: 1928/09/26 (84 y.o. F) Treating RN: Renne Crigler Primary Care Jennefer Kopp: Lois Huxley Other Clinician: Referring Delani Kohli: Lois Huxley Treating Takiesha Mcdevitt/Extender: Linwood Dibbles, HOYT Weeks in Treatment: 5 Electronic Signature(s) Signed: 10/25/2017 4:07:12 PM By: Renne Crigler Entered By: Renne Crigler on 10/25/2017 11:00:09 [name] ([id number]) -------------------------------------------------------------------------------- Multi Wound Chart Details Patient Name: [name], [name] Date of Service: 10/25/2017 10:15 AM Medical Record Number: [id number] Patient Account Number: [id number] Date of Birth/Sex: 01-16-1929 (84 y.o. F) Treating RN: Curtis Sites Primary Care Laronica Bhagat: Lois Huxley Other Clinician: Referring Dreden Rivere: Lois Huxley Treating Jantz Main/Extender: STONE III, HOYT Weeks in Treatment: 5 Vital Signs Height(in): 61 Pulse(bpm): 77 Weight(lbs): Blood Pressure(mmHg): 118/57 Body Mass Index(BMI): Temperature(F): 98.0 Respiratory Rate 18 (breaths/min): Photos: [1:No Photos] [N/A:N/A] Wound Location: [1:Sacrum] [N/A:N/A] Wounding Event: [1:Pressure Injury] [N/A:N/A] Primary Etiology: [1:Pressure Ulcer] [N/A:N/A] Comorbid History: [1:Cataracts, Anemia, Hypertension,  Type II Diabetes, Rheumatoid Arthritis, Osteoarthritis, Received Chemotherapy, Received Radiation] [N/A:N/A] Date Acquired: [1:07/08/2017] [N/A:N/A] Weeks of Treatment: [1:5] [N/A:N/A] Wound Status: [1:Open] [N/A:N/A] Measurements L x W x D [1:3.8x5.3x0.9] [N/A:N/A] (cm) Area (cm) : [1:15.818] [N/A:N/A] Volume (cm) : [1:14.236] [N/A:N/A] % Reduction in Area: [1:-17.80%] [N/A:N/A] % Reduction in Volume: [1:24.30%] [N/A:N/A] Starting Position 1 [1:12] (o'clock): Ending Position 1 (o'clock): Maximum Distance 1 (cm): [1:1.7] Undermining: [1:Yes] [N/A:N/A] Classification: [1:Category/Stage IV] [N/A:N/A] Exudate Amount: [1:Large] [N/A:N/A] Exudate Type: [1:Purulent] [N/A:N/A] Exudate Color: [1:yellow, brown, green] [N/A:N/A] Foul Odor After Cleansing: [1:Yes] [N/A:N/A] Odor Anticipated Due to [1:No] [N/A:N/A] Product Use: Wound Margin: [1:Flat and Intact] [N/A:N/A] Granulation Amount: [1:Medium (34-66%)] [N/A:N/A] Granulation Quality: [  1:Pink] [N/A:N/A] Necrotic Amount: [1:Medium (34-66%)] [N/A:N/A] Necrotic Tissue: [1:Eschar, Adherent Slough] [N/A:N/A] Exposed Structures: Fat Layer (Subcutaneous N/A N/A Tissue) Exposed: Yes Bone: Yes Fascia: No Tendon: No Muscle: No Joint: No Epithelialization: None N/A N/A Periwound Skin Texture: Excoriation: No N/A N/A Induration: No Callus: No Crepitus: No Rash: No Scarring: No Periwound Skin Moisture: Maceration: No N/A N/A Dry/Scaly: No Periwound Skin Color: Atrophie Blanche: No N/A N/A Cyanosis: No Ecchymosis: No Erythema: No Hemosiderin Staining: No Mottled: No Pallor: No Rubor: No Temperature: No Abnormality N/A N/A Tenderness on Palpation: Yes N/A N/A Wound Preparation: Ulcer Cleansing: N/A N/A Rinsed/Irrigated with Saline Topical Anesthetic Applied: None, Other: hurricaine spray Treatment Notes Electronic Signature(s) Signed: 10/25/2017 4:37:07 PM By: Curtis Sites Entered By: Curtis Sites on 10/25/2017  11:15:03 [name] ([id number]) -------------------------------------------------------------------------------- Multi-Disciplinary Care Plan Details Patient Name: [name], [name]. Date of Service: 10/25/2017 10:15 AM Medical Record Number: [id number] Patient Account Number: [id number] Date of Birth/Sex: 07/06/1928 (84 y.o. F) Treating RN: Curtis Sites Primary Care Juliocesar Blasius: Lois Huxley Other Clinician: Referring Cloie Wooden: Lois Huxley Treating Korynne Dols/Extender: Linwood Dibbles, HOYT Weeks in Treatment: 5 Active Inactive ` Abuse / Safety / Falls / Self Care Management Nursing Diagnoses: Potential for falls Goals: Patient will not experience any injury related to falls Date Initiated: 09/19/2017 Target Resolution Date: 01/04/2018 Goal Status: Active Interventions: Assess Activities of Daily Living upon admission and as needed Assess fall risk on admission and as needed Assess: immobility, friction, shearing, incontinence upon admission and as needed Assess impairment of mobility on admission and as needed per policy Notes: ` Nutrition Nursing Diagnoses: Imbalanced nutrition Potential for alteratiion in Nutrition/Potential for imbalanced nutrition Goals: Patient/caregiver agrees to and verbalizes understanding of need to use nutritional supplements and/or vitamins as prescribed Date Initiated: 09/19/2017 Target Resolution Date: 01/04/2018 Goal Status: Active Interventions: Assess patient nutrition upon admission and as needed per policy Notes: ` Orientation to the Wound Care Program Nursing Diagnoses: Knowledge deficit related to the wound healing center program Goals: Patient/caregiver will verbalize understanding of the Wound Healing Center Program [place], CHRISTIANNE ZACHER ([id number]) Date Initiated: 09/19/2017 Target Resolution Date: 10/05/2017 Goal Status: Active Interventions: Provide education on orientation to the wound center Notes: ` Pain, Acute or Chronic Nursing  Diagnoses: Pain, acute or chronic: actual or potential Potential alteration in comfort, pain Goals: Patient/caregiver will verbalize adequate pain control between visits Date Initiated: 09/19/2017 Target Resolution Date: 01/04/2018 Goal Status: Active Interventions: Complete pain assessment as per visit requirements Encourage patient to take pain medications as prescribed Notes: ` Wound/Skin Impairment Nursing Diagnoses: Impaired tissue integrity Knowledge deficit related to ulceration/compromised skin integrity Goals: Ulcer/skin breakdown will have a volume reduction of 80% by week 12 Date Initiated: 09/19/2017 Target Resolution Date: 01/04/2018 Goal Status: Active Interventions: Assess patient/caregiver ability to perform ulcer/skin care regimen upon admission and as needed Assess ulceration(s) every visit Notes: Electronic Signature(s) Signed: 10/25/2017 4:37:07 PM By: Curtis Sites Entered By: Curtis Sites on 10/25/2017 11:14:51 [place], [name] ([id number]) -------------------------------------------------------------------------------- Pain Assessment Details Patient Name: [name]. Date of Service: 10/25/2017 10:15 AM Medical Record Number: [id number] Patient Account Number: [id number] Date of Birth/Sex: 1929-03-18 (84 y.o. F) Treating RN: Renne Crigler Primary Care Arlethia Basso: Lois Huxley Other Clinician: Referring Anistyn Graddy: Lois Huxley Treating Carnita Golob/Extender: Linwood Dibbles, HOYT Weeks in Treatment: 5 Active Problems Location of Pain Severity and Description of Pain Patient Has Paino Yes Site Locations Pain Location: Pain in Ulcers Duration of the Pain. Constant / Intermittento Intermittent Rate the pain. Current Pain Level: 4 Character of  Pain Describe the Pain: Aching Pain Management and Medication Current Pain Management: Electronic Signature(s) Signed: 10/25/2017 4:07:12 PM By: Renne Crigler Entered By: Renne Crigler on 10/25/2017  10:53:02 [place], [name] ([id number]) -------------------------------------------------------------------------------- Patient/Caregiver Education Details Patient Name: [name]. Date of Service: 10/25/2017 10:15 AM Medical Record Number: [id number] Patient Account Number: [id number] Date of Birth/Gender: 01/20/1929 (84 y.o. F) Treating RN: Phillis Haggis Primary Care Physician: Lois Huxley Other Clinician: Referring Physician: Lois Huxley Treating Physician/Extender: Skeet Simmer in Treatment: 5 Education Assessment Education Provided To: Patient Education Topics Provided Wound/Skin Impairment: Handouts: Caring for Your Ulcer, Other: change dressing as ordered Methods: Demonstration, Explain/Verbal Responses: State content correctly Electronic Signature(s) Signed: 10/25/2017 4:29:16 PM By: Alejandro Mulling Entered By: Alejandro Mulling on 10/25/2017 11:50:03 [place], [name] ([id number]) -------------------------------------------------------------------------------- Wound Assessment Details Patient Name: [name]. Date of Service: 10/25/2017 10:15 AM Medical Record Number: [id number] Patient Account Number: [id number] Date of Birth/Sex: Feb 10, 1929 (84 y.o. F) Treating RN: Renne Crigler Primary Care Wallie Lagrand: Lois Huxley Other Clinician: Referring Florence Antonelli: Lois Huxley Treating Octavius Shin/Extender: Linwood Dibbles, HOYT Weeks in Treatment: 5 Wound Status Wound Number: 1 Primary Pressure Ulcer Etiology: Wound Location: Sacrum Wound Open Wounding Event: Pressure Injury Status: Date Acquired: 07/08/2017 Comorbid Cataracts, Anemia, Hypertension, Type II Weeks Of Treatment: 5 History: Diabetes, Rheumatoid Arthritis, Osteoarthritis, Clustered Wound: No Received Chemotherapy, Received Radiation Photos Photo Uploaded By: Elliot Gurney, BSN, RN, CWS, Kim on 10/25/2017 16:04:28 Wound Measurements Length: (cm) 3.8 % Reducti Width: (cm) 5.3 % Reducti Depth: (cm) 0.9  Epithelia Area: (cm) 15.818 Tunnelin Volume: (cm) 14.236 Undermin Starti Maximu on in Area: -17.8% on in Volume: 24.3% lization: None g: No ing: Yes ng Position (o'clock): 12 m Distance: (cm) 1.7 Wound Description Classification: Category/Stage IV Foul Odor Wound Margin: Flat and Intact Due to Pr Exudate Amount: Large Slough/Fi Exudate Type: Purulent Exudate Color: yellow, brown, green After Cleansing: Yes oduct Use: No brino No Wound Bed Granulation Amount: Medium (34-66%) Exposed Structure Granulation Quality: Pink Fascia Exposed: No Necrotic Amount: Medium (34-66%) Fat Layer (Subcutaneous Tissue) Exposed: Yes Necrotic Quality: Eschar, Adherent Slough Tendon Exposed: No Muscle Exposed: No Joint Exposed: No Hennes, Jancy J. ([id number]) Bone Exposed: Yes Periwound Skin Texture Texture Color No Abnormalities Noted: No No Abnormalities Noted: No Callus: No Atrophie Blanche: No Crepitus: No Cyanosis: No Excoriation: No Ecchymosis: No Induration: No Erythema: No Rash: No Hemosiderin Staining: No Scarring: No Mottled: No Pallor: No Moisture Rubor: No No Abnormalities Noted: No Dry / Scaly: No Temperature / Pain Maceration: No Temperature: No Abnormality Tenderness on Palpation: Yes Wound Preparation Ulcer Cleansing: Rinsed/Irrigated with Saline Topical Anesthetic Applied: None, Other: hurricaine spray, Treatment Notes Wound #1 (Sacrum) 1. Cleansed with: Clean wound with Normal Saline 2. Anesthetic Hurricaine Topical Anesthetic Spray 3. Peri-wound Care: Skin Prep 4. Dressing Applied: Other dressing (specify in notes) 5. Secondary Dressing Applied Bordered Foam Dressing Dry Gauze Notes dakins soaked gauze and covered with border foam gauze Electronic Signature(s) Signed: 10/25/2017 4:07:12 PM By: Renne Crigler Entered By: Renne Crigler on 10/25/2017 10:59:45 [place], [name]  ([id number]) -------------------------------------------------------------------------------- Vitals Details Patient Name: [name]. Date of Service: 10/25/2017 10:15 AM Medical Record Number: [id number] Patient Account Number: [id number] Date of Birth/Sex: Aug 15, 1928 (84 y.o. F) Treating RN: Renne Crigler Primary Care Manav Pierotti: Lois Huxley Other Clinician: Referring Zayed Griffie: Lois Huxley Treating Zaniya Mcaulay/Extender: Linwood Dibbles, HOYT Weeks in Treatment: 5 Vital Signs Time Taken: 10:53 Temperature (F): 98.0 Height (in): 61 Pulse (bpm): 77 Source: Stated Respiratory Rate (breaths/min): 18 Blood Pressure (mmHg):  118/57 Reference Range: 80 - 120 mg / dl Electronic Signature(s) Signed: 10/25/2017 4:07:12 PM By: Renne Crigler Entered By: Renne Crigler on 10/25/2017 10:53:40

## 2017-10-31 NOTE — Progress Notes (Signed)
LAYA, LETENDRE ([id number]) Visit Report for 10/25/2017 Chief Complaint Document Details Patient Name: [name], [name]. Date of Service: 10/25/2017 10:15 AM Medical Record Number: [id number] Patient Account Number: [id number] Date of Birth/Sex: [birth year] (84 y.o. F) Treating RN: Curtis Sites Primary Care Provider: Lois Huxley Other Clinician: Referring Provider: Lois Huxley Treating Provider/Extender: Linwood Dibbles, Tramya Schoenfelder Weeks in Treatment: 5 Information Obtained from: Patient Chief Complaint She is here for evaluation of a sacrococcygeal pressure ulcer Electronic Signature(s) Signed: 10/25/2017 5:37:52 PM By: Lenda Kelp PA-C Entered By: Lenda Kelp on 10/25/2017 10:41:03 Broadhead, [name] ([id number]) -------------------------------------------------------------------------------- Debridement Details Patient Name: [name]. Date of Service: 10/25/2017 10:15 AM Medical Record Number: [id number] Patient Account Number: [id number] Date of Birth/Sex: 1928-09-25 (84 y.o. F) Treating RN: Curtis Sites Primary Care Provider: Lois Huxley Other Clinician: Referring Provider: Lois Huxley Treating Provider/Extender: Linwood Dibbles, Traveion Ruddock Weeks in Treatment: 5 Debridement Performed for Wound #1 Sacrum Assessment: Performed By: Physician STONE III, Flay Ghosh E., PA-C Debridement Type: Debridement Pre-procedure Verification/Time Yes - 11:18 Out Taken: Start Time: 11:18 Pain Control: Other : hurricaine spray Total Area Debrided (L x W): 3.8 (cm) x 5.3 (cm) = 20.14 (cm) Tissue and other material Viable, Non-Viable, Slough, Subcutaneous, Biofilm, Fibrin/Exudate, Slough debrided: Level: Skin/Subcutaneous Tissue Debridement Description: Excisional Instrument: Curette Bleeding: Minimum Hemostasis Achieved: Pressure End Time: 11:20 Procedural Pain: 0 Post Procedural Pain: 0 Response to Treatment: Procedure was tolerated well Post Debridement Measurements of Total Wound Length:  (cm) 3.8 Stage: Category/Stage IV Width: (cm) 5.3 Depth: (cm) 0.9 Volume: (cm) 14.236 Character of Wound/Ulcer Post Improved Debridement: Post Procedure Diagnosis Same as Pre-procedure Electronic Signature(s) Signed: 10/25/2017 4:37:07 PM By: Curtis Sites Signed: 10/25/2017 5:37:52 PM By: Lenda Kelp PA-C Entered By: Curtis Sites on 10/25/2017 11:21:13 [name] ([id number]) -------------------------------------------------------------------------------- HPI Details Patient Name: [name]. Date of Service: 10/25/2017 10:15 AM Medical Record Number: [id number] Patient Account Number: [id number] Date of Birth/Sex: January 19, 1929 (84 y.o. F) Treating RN: Curtis Sites Primary Care Provider: Lois Huxley Other Clinician: Referring Provider: Lois Huxley Treating Provider/Extender: Linwood Dibbles, Barbette Mcglaun Weeks in Treatment: 5 History of Present Illness HPI Description: 09/19/17-She is here for initial evaluation of the sacrococcygeal pressure ulcer. She states she was hospitalized for 1 month December-January in [place]; she was out of town for her brother's funeral at the time. While hospitalized she developed a sacral ulcer and was discharged to [facility] and [place]. She is nonambulatory, Hoyer lift dependent. She states she is repositioned while in bed but spends approximately 5-8 hours a day in the wheelchair. She does admit to pain with sitting. She denies being on any antibiotic therapy or any specific evaluation for her sacral ulcer since arriving to rehabilitation. She has a listed allergy to lidocaine, but states this was a combination medication of Aspercreme with lidocaine that she had a reaction to. She tolerated debridement of nonviable tissue today, there is no overt sign of infection and I expect a swab culture would produce a polymicrobial result which may not reflect an acute infection. We will treat with Dakin's wet-to-dry daily and she will  follow-up next week. 09/26/17-She is here for evaluation for stage IV sacral pressure ulcer. Mobile x-ray was performed at her facility and the conclusion reads findings suspicious for osteomyelitis involving the inferior sacrum; we will initiate her on doxycycline and refer her to infectious disease for their input. She had a pre-albumin drawn, level 7; referral for facility dietitian to re-evaluate. There is improvement in amount  of non-viable tissue, granulation tissue present; will continue with same treatment plan and follow up next week. 10/04/17 on evaluation today patient presents for follow-up concerning her sacral ulcer. She continues to experience discomfort although we did actually attempt been the cane spray today after discussing this with patient and her son and she seems to have tolerated this very well without any complication. There was no evidence erythema, the pain was improved, and in general I was pleased with this fact. The patient continues to be on doxycycline at this point she is going to be seeing that to [facility] we're not exactly sure when that appointment is we will check on that today. Otherwise the patient seems to be tolerating the dressing changes fairly well her son does state she had a difficult ride here today she does have a new Roho cushion he's unsure if this somehow has affected things and made it worse. 10/18/17 on evaluation today patient appears to be doing rather well in regard to her sacral wound. She has been tolerating the dressing changes without complication. With that being said she has no evidence of infection there still some Slough noted currently on the surface of the wound special at the 12 o'clock location which did require sharp debridement today. She tolerated this without complication at this point. Overall I am happy with how things seem to be progressing. 10/25/17 on evaluation today patient sacral wound actually appears to be doing better  there is a little bit of slough still noted in the base of the wound so I believe we will likely be able to completely debride this away today. I feel she's at the point where a Wound VAC would be appropriate. Fortunately she's not having any significant pain. Electronic Signature(s) Signed: 10/25/2017 5:37:52 PM By: Lenda Kelp PA-C Entered By: Lenda Kelp on 10/25/2017 17:05:36 Connole, [name] ([id number]) -------------------------------------------------------------------------------- Physical Exam Details Patient Name: [name], [name] Date of Service: 10/25/2017 10:15 AM Medical Record Number: [id number] Patient Account Number: [id number] Date of Birth/Sex: 1928/11/15 (84 y.o. F) Treating RN: Curtis Sites Primary Care Provider: Lois Huxley Other Clinician: Referring Provider: Lois Huxley Treating Provider/Extender: STONE III, Alyze Lauf Weeks in Treatment: 5 Constitutional Well-nourished and well-hydrated in no acute distress. Respiratory normal breathing without difficulty. Psychiatric this patient is able to make decisions and demonstrates good insight into disease process. Alert and Oriented x 3. pleasant and cooperative. Notes Patient's wound shows signs of good granulation centrally that was some Slough and the perimeter which did require sharp debridement she tolerated this well today without complication and post debridement the wound bed appear to be doing much better. Overall I'm pleased in that regard. Electronic Signature(s) Signed: 10/25/2017 5:37:52 PM By: Lenda Kelp PA-C Entered By: Lenda Kelp on 10/25/2017 17:06:12 Hillman, [name] ([id number]) -------------------------------------------------------------------------------- Physician Orders Details Patient Name: [name], [name] Date of Service: 10/25/2017 10:15 AM Medical Record Number: [id number] Patient Account Number: [id number] Date of Birth/Sex: 1928/11/02 (84 y.o. F) Treating RN: Curtis Sites Primary Care Provider: Lois Huxley Other Clinician: Referring Provider: Lois Huxley Treating Provider/Extender: Linwood Dibbles, Modean Mccullum Weeks in Treatment: 5 Verbal / Phone Orders: No Diagnosis Coding ICD-10 Coding Code Description L89.154 Pressure ulcer of sacral region, stage 4 Z99.3 Dependence on wheelchair R54 Age-related physical debility M46.28 Osteomyelitis of vertebra, sacral and sacrococcygeal region E43 Unspecified severe protein-calorie malnutrition Wound Cleansing Wound #1 Sacrum o Clean wound with Normal Saline. Anesthetic (add to Medication List) Wound #1 Sacrum o Benzocaine Topical Anesthetic Spray  applied to wound bed prior to debridement (In Clinic Only). Skin Barriers/Peri-Wound Care Wound #1 Sacrum o Skin Prep Primary Wound Dressing Wound #1 Sacrum o Other: - Dakin's Solution moisten gauze - until NPWT is available Secondary Dressing Wound #1 Sacrum o Dry Gauze o Boardered Foam Dressing Dressing Change Frequency Wound #1 Sacrum o Change dressing every day. - until NPWT is available then change NPWT on Mondays, Wednesdays and Fridays Follow-up Appointments Wound #1 Sacrum o Return Appointment in 1 week. Off-Loading Wound #1 Sacrum o Mattress - Group II [place], Venus J. ([id number]) o Turn and reposition every 2 hours Additional Orders / Instructions Wound #1 Sacrum o Increase protein intake. o Other: - Vitamin C, Zinc Dietitian Evaluation for protein intake adjustments Negative Pressure Wound Therapy Wound #1 Sacrum o Wound VAC settings at 125/130 mmHg continuous pressure. Use BLACK/GREEN foam to wound cavity. Use WHITE foam to fill any tunnel/s and/or undermining. Change VAC dressing 3 X WEEK. Change canister as indicated when full. Nurse may titrate settings and frequency of dressing changes as clinically indicated. - PLEASE DRAPE AROUND WOUND WITH NPWT DRAPE TO PROTECT PERIWOUND SKIN - SNF TO PROVIDE NPWT FOR PATIENT  AND INITIATE WHEN AVAILABLE Electronic Signature(s) Signed: 10/25/2017 4:37:07 PM By: Curtis Sites Signed: 10/25/2017 5:37:52 PM By: Lenda Kelp PA-C Entered By: Curtis Sites on 10/25/2017 11:23:23 Beck, [name] ([id number]) -------------------------------------------------------------------------------- Problem List Details Patient Name: JOVI, ALVIZO. Date of Service: 10/25/2017 10:15 AM Medical Record Number: [id number] Patient Account Number: [id number] Date of Birth/Sex: Jun 21, 1929 (84 y.o. F) Treating RN: Curtis Sites Primary Care Provider: Lois Huxley Other Clinician: Referring Provider: Lois Huxley Treating Provider/Extender: Linwood Dibbles, Timothea Bodenheimer Weeks in Treatment: 5 Active Problems ICD-10 Impacting Encounter Code Description Active Date Wound Healing Diagnosis L89.154 Pressure ulcer of sacral region, stage 4 09/19/2017 Yes Z99.3 Dependence on wheelchair 09/19/2017 Yes R54 Age-related physical debility 09/19/2017 Yes M46.28 Osteomyelitis of vertebra, sacral and sacrococcygeal region 09/26/2017 Yes E43 Unspecified severe protein-calorie malnutrition 09/26/2017 Yes Inactive Problems Resolved Problems Electronic Signature(s) Signed: 10/25/2017 5:37:52 PM By: Lenda Kelp PA-C Entered By: Lenda Kelp on 10/25/2017 10:40:57 Shishido, [name] ([id number]) -------------------------------------------------------------------------------- Progress Note Details Patient Name: [name]. Date of Service: 10/25/2017 10:15 AM Medical Record Number: [id number] Patient Account Number: [id number] Date of Birth/Sex: Dec 14, 1928 (84 y.o. F) Treating RN: Curtis Sites Primary Care Provider: Lois Huxley Other Clinician: Referring Provider: Lois Huxley Treating Provider/Extender: Linwood Dibbles, Kyree Adriano Weeks in Treatment: 5 Subjective Chief Complaint Information obtained from Patient She is here for evaluation of a sacrococcygeal pressure ulcer History of Present Illness  (HPI) 09/19/17-She is here for initial evaluation of the sacrococcygeal pressure ulcer. She states she was hospitalized for 1 month December-January in [place]; she was out of town for her brother's funeral at the time. While hospitalized she developed a sacral ulcer and was discharged to [facility] and [place]. She is nonambulatory, Hoyer lift dependent. She states she is repositioned while in bed but spends approximately 5-8 hours a day in the wheelchair. She does admit to pain with sitting. She denies being on any antibiotic therapy or any specific evaluation for her sacral ulcer since arriving to rehabilitation. She has a listed allergy to lidocaine, but states this was a combination medication of Aspercreme with lidocaine that she had a reaction to. She tolerated debridement of nonviable tissue today, there is no overt sign of infection and I expect a swab culture would produce a polymicrobial result which may not reflect an acute  infection. We will treat with Dakin's wet-to-dry daily and she will follow-up next week. 09/26/17-She is here for evaluation for stage IV sacral pressure ulcer. Mobile x-ray was performed at her facility and the conclusion reads findings suspicious for osteomyelitis involving the inferior sacrum; we will initiate her on doxycycline and refer her to infectious disease for their input. She had a pre-albumin drawn, level 7; referral for facility dietitian to re-evaluate. There is improvement in amount of non-viable tissue, granulation tissue present; will continue with same treatment plan and follow up next week. 10/04/17 on evaluation today patient presents for follow-up concerning her sacral ulcer. She continues to experience discomfort although we did actually attempt been the cane spray today after discussing this with patient and her son and she seems to have tolerated this very well without any complication. There was no evidence erythema, the pain  was improved, and in general I was pleased with this fact. The patient continues to be on doxycycline at this point she is going to be seeing that to [facility] we're not exactly sure when that appointment is we will check on that today. Otherwise the patient seems to be tolerating the dressing changes fairly well her son does state she had a difficult ride here today she does have a new Roho cushion he's unsure if this somehow has affected things and made it worse. 10/18/17 on evaluation today patient appears to be doing rather well in regard to her sacral wound. She has been tolerating the dressing changes without complication. With that being said she has no evidence of infection there still some Slough noted currently on the surface of the wound special at the 12 o'clock location which did require sharp debridement today. She tolerated this without complication at this point. Overall I am happy with how things seem to be progressing. 10/25/17 on evaluation today patient sacral wound actually appears to be doing better there is a little bit of slough still noted in the base of the wound so I believe we will likely be able to completely debride this away today. I feel she's at the point where a Wound VAC would be appropriate. Fortunately she's not having any significant pain. Patient History Information obtained from Patient. Family History Cancer - Child, No family history of Diabetes, Heart Disease, Hereditary Spherocytosis, Hypertension, Kidney Disease, Lung Disease, Seizures, Stroke, Thyroid Problems, Tuberculosis. JULICIA, KRIEGER ([id number]) Social History Never smoker, Marital Status - Widowed, Alcohol Use - Never, Drug Use - No History, Caffeine Use - Rarely. Medical And Surgical History Notes Cardiovascular hyperlipidemia Gastrointestinal hernia repair December 2018, colostomy Oncologic rectal cancer 20 years ago Review of Systems (ROS) Constitutional Symptoms (General  Health) Denies complaints or symptoms of Fever, Chills. Respiratory The patient has no complaints or symptoms. Cardiovascular The patient has no complaints or symptoms. Psychiatric The patient has no complaints or symptoms. Objective Constitutional Well-nourished and well-hydrated in no acute distress. Vitals Time Taken: 10:53 AM, Height: 61 in, Source: Stated, Temperature: 98.0 F, Pulse: 77 bpm, Respiratory Rate: 18 breaths/min, Blood Pressure: 118/57 mmHg. Respiratory normal breathing without difficulty. Psychiatric this patient is able to make decisions and demonstrates good insight into disease process. Alert and Oriented x 3. pleasant and cooperative. General Notes: Patient's wound shows signs of good granulation centrally that was some Slough and the perimeter which did require sharp debridement she tolerated this well today without complication and post debridement the wound bed appear to be doing much better. Overall I'm pleased in that regard. Integumentary (Hair,  Skin) Wound #1 status is Open. Original cause of wound was Pressure Injury. The wound is located on the Sacrum. The wound measures 3.8cm length x 5.3cm width x 0.9cm depth; 15.818cm^2 area and 14.236cm^3 volume. There is bone and Fat Layer (Subcutaneous Tissue) Exposed exposed. There is no tunneling noted, however, there is undermining starting at 12:00 and ending at :00 with a maximum distance of 1.7cm. There is a large amount of purulent drainage noted. Foul odor after cleansing was noted. The wound margin is flat and intact. There is medium (34-66%) pink granulation within the wound bed. There is a medium (34-66%) amount of necrotic tissue within the wound bed including Eschar and Adherent Slough. The periwound skin appearance did not exhibit: Callus, Crepitus, Excoriation, Induration, Rash, Scarring, Dry/Scaly, Maceration, Atrophie Blanche, Cyanosis, Ecchymosis, Hemosiderin Staining, Mottled, Pallor, Rubor,  Erythema. Periwound temperature [place], [place] J. ([id number]) was noted as No Abnormality. The periwound has tenderness on palpation. Assessment Active Problems ICD-10 L89.154 - Pressure ulcer of sacral region, stage 4 Z99.3 - Dependence on wheelchair R54 - Age-related physical debility M46.28 - Osteomyelitis of vertebra, sacral and sacrococcygeal region E43 - Unspecified severe protein-calorie malnutrition Procedures Wound #1 Pre-procedure diagnosis of Wound #1 is a Pressure Ulcer located on the Sacrum . There was a Excisional Skin/Subcutaneous Tissue Debridement with a total area of 20.14 sq cm performed by STONE III, Freya Zobrist E., PA-C. With the following instrument(s): Curette. to remove Viable and Non-Viable tissue/material Material removed includes Subcutaneous Tissue, and Slough, Biofilm, Fibrin/Exudate, and Slough after achieving pain control using Other (hurricaine spray). No specimens were taken. A time out was conducted at 11:18, prior to the start of the procedure. A Minimum amount of bleeding was controlled with Pressure. The procedure was tolerated well with a pain level of 0 throughout and a pain level of 0 following the procedure. Post Debridement Measurements: 3.8cm length x 5.3cm width x 0.9cm depth; 14.236cm^3 volume. Post debridement Stage noted as Category/Stage IV. Character of Wound/Ulcer Post Debridement is improved. Post procedure Diagnosis Wound #1: Same as Pre-Procedure Plan Wound Cleansing: Wound #1 Sacrum: Clean wound with Normal Saline. Anesthetic (add to Medication List): Wound #1 Sacrum: Benzocaine Topical Anesthetic Spray applied to wound bed prior to debridement (In Clinic Only). Skin Barriers/Peri-Wound Care: Wound #1 Sacrum: Skin Prep Primary Wound Dressing: Wound #1 Sacrum: Other: - Dakin's Solution moisten gauze - until NPWT is available Secondary Dressing: Wound #1 Sacrum: Dry Gauze Boardered Foam Dressing CALIANNE, LARUE ([id number]) Dressing  Change Frequency: Wound #1 Sacrum: Change dressing every day. - until NPWT is available then change NPWT on Mondays, Wednesdays and Fridays Follow-up Appointments: Wound #1 Sacrum: Return Appointment in 1 week. Off-Loading: Wound #1 Sacrum: Mattress - Group II Turn and reposition every 2 hours Additional Orders / Instructions: Wound #1 Sacrum: Increase protein intake. Other: - Vitamin C, Zinc Dietitian Evaluation for protein intake adjustments Negative Pressure Wound Therapy: Wound #1 Sacrum: Wound VAC settings at 125/130 mmHg continuous pressure. Use BLACK/GREEN foam to wound cavity. Use WHITE foam to fill any tunnel/s and/or undermining. Change VAC dressing 3 X WEEK. Change canister as indicated when full. Nurse may titrate settings and frequency of dressing changes as clinically indicated. - PLEASE DRAPE AROUND WOUND WITH NPWT DRAPE TO PROTECT PERIWOUND SKIN - SNF TO PROVIDE NPWT FOR PATIENT AND INITIATE WHEN AVAILABLE I think we are definitely at this stage we can go ahead and see about initiating the Wound VAC I think it would be beneficial for her. I did place the  order for this today. We will see were things stand in one weeks time. Please see above for specific wound care orders. We will see patient for re-evaluation in 1 week(s) here in the clinic. If anything worsens or changes patient will contact our office for additional recommendations. Electronic Signature(s) Signed: 10/25/2017 5:37:52 PM By: Lenda Kelp PA-C Entered By: Lenda Kelp on 10/25/2017 17:06:39 Toccopola, [name] ([id number]) -------------------------------------------------------------------------------- ROS/PFSH Details Patient Name: KAMELLA, GROSHANS Date of Service: 10/25/2017 10:15 AM Medical Record Number: [id number] Patient Account Number: [id number] Date of Birth/Sex: [birth year] (84 y.o. F) Treating RN: Curtis Sites Primary Care Provider: Lois Huxley Other Clinician: Referring Provider: Lois Huxley Treating Provider/Extender: Linwood Dibbles, Shandy Vi Weeks in Treatment: 5 Information Obtained From Patient Wound History Do you currently have one or more open woundso Yes How many open wounds do you currently haveo 1 Approximately how long have you had your woundso since December How have you been treating your wound(s) until nowo dakins Has your wound(s) ever healed and then re-openedo No Have you had any lab work done in the past montho No Have you tested positive for an antibiotic resistant organism (MRSA, VRE)o No Have you tested positive for osteomyelitis (bone infection)o No Have you had any tests for circulation on your legso No Constitutional Symptoms (General Health) Complaints and Symptoms: Negative for: Fever; Chills Eyes Medical History: Positive for: Cataracts - removed Hematologic/Lymphatic Medical History: Positive for: Anemia Respiratory Complaints and Symptoms: No Complaints or Symptoms Medical History: Negative for: Aspiration; Asthma; Chronic Obstructive Pulmonary Disease (COPD); Pneumothorax; Sleep Apnea; Tuberculosis Cardiovascular Complaints and Symptoms: No Complaints or Symptoms Medical History: Positive for: Hypertension Negative for: Angina; Arrhythmia; Congestive Heart Failure; Coronary Artery Disease; Deep Vein Thrombosis; Hypotension; Myocardial Infarction; Peripheral Arterial Disease; Peripheral Venous Disease; Phlebitis; Vasculitis Past Medical History Notes: hyperlipidemia ALGENE, HOLLANDER ([id number]) Gastrointestinal Medical History: Negative for: Cirrhosis ; Colitis; Crohnos; Hepatitis A; Hepatitis B; Hepatitis C Past Medical History Notes: hernia repair December 2018, colostomy Endocrine Medical History: Positive for: Type II Diabetes Immunological Medical History: Negative for: Lupus Erythematosus; Raynaudos Integumentary (Skin) Medical History: Negative for: History of Burn; History of pressure wounds Musculoskeletal Medical  History: Positive for: Rheumatoid Arthritis; Osteoarthritis Neurologic Medical History: Negative for: Dementia; Neuropathy Oncologic Medical History: Positive for: Received Chemotherapy; Received Radiation Past Medical History Notes: rectal cancer 20 years ago Psychiatric Complaints and Symptoms: No Complaints or Symptoms HBO Extended History Items Eyes: Cataracts Immunizations Pneumococcal Vaccine: Received Pneumococcal Vaccination: Yes Immunization Notes: up to date Implantable Devices Family and Social History Cancer: Yes - Child; Diabetes: No; Heart Disease: No; Hereditary Spherocytosis: No; Hypertension: No; Kidney Disease: No; Lung Disease: No; Seizures: No; Stroke: No; Thyroid Problems: No; Tuberculosis: No; Never smoker; Marital Status - LHOTKA, November J. ([id number]) Widowed; Alcohol Use: Never; Drug Use: No History; Caffeine Use: Rarely; Financial Concerns: No; Food, Clothing or Shelter Needs: No; Support System Lacking: No; Transportation Concerns: No; Advanced Directives: No; Patient does not want information on Advanced Directives Physician Affirmation I have reviewed and agree with the above information. Electronic Signature(s) Signed: 10/25/2017 5:37:52 PM By: Lenda Kelp PA-C Signed: 10/28/2017 4:56:01 PM By: Curtis Sites Entered By: Lenda Kelp on 10/25/2017 17:05:58 Finchum, [name] ([id number]) -------------------------------------------------------------------------------- SuperBill Details Patient Name: AMMY, MCELVEEN Date of Service: 10/25/2017 Medical Record Number: [id number] Patient Account Number: [id number] Date of Birth/Sex: 23-Apr-1929 (84 y.o. 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 Pressure ulcer of sacral region, stage 4 Modifier: Quantity: 1 CPT4 Code: [id number] Description: 11045 - DEB SUBQ TISS EA ADDL 20CM ICD-10 Diagnosis Description L89.154 Pressure ulcer of sacral region, stage 4 Modifier: Quantity: 1 Physician Procedures CPT4 Code: [id number] Description: 11042 - WC PHYS SUBQ TISS 20 SQ CM ICD-10 Diagnosis Description L89.154 Pressure ulcer of sacral region, stage 4 Modifier: Quantity: 1 CPT4 Code: [id number] Description: 11045 - WC PHYS SUBQ TISS EA ADDL 20 CM ICD-10 Diagnosis Description L89.154 Pressure ulcer of sacral region, stage 4 Modifier: Quantity: 1 Electronic Signature(s) Signed: 10/25/2017 5:37:52 PM By: Lenda Kelp PA-C Entered By: Lenda Kelp on 10/25/2017 17:07:03

## 2017-11-01 ENCOUNTER — Encounter: Payer: Medicare Other | Attending: Physician Assistant | Admitting: Physician Assistant

## 2017-11-01 DIAGNOSIS — Z9221 Personal history of antineoplastic chemotherapy: Secondary | ICD-10-CM | POA: Diagnosis not present

## 2017-11-01 DIAGNOSIS — Z993 Dependence on wheelchair: Secondary | ICD-10-CM | POA: Insufficient documentation

## 2017-11-01 DIAGNOSIS — Z85048 Personal history of other malignant neoplasm of rectum, rectosigmoid junction, and anus: Secondary | ICD-10-CM | POA: Insufficient documentation

## 2017-11-01 DIAGNOSIS — Z933 Colostomy status: Secondary | ICD-10-CM | POA: Diagnosis not present

## 2017-11-01 DIAGNOSIS — L89154 Pressure ulcer of sacral region, stage 4: Secondary | ICD-10-CM | POA: Diagnosis not present

## 2017-11-01 DIAGNOSIS — I10 Essential (primary) hypertension: Secondary | ICD-10-CM | POA: Insufficient documentation

## 2017-11-01 DIAGNOSIS — E119 Type 2 diabetes mellitus without complications: Secondary | ICD-10-CM | POA: Insufficient documentation

## 2017-11-01 DIAGNOSIS — Z923 Personal history of irradiation: Secondary | ICD-10-CM | POA: Diagnosis not present

## 2017-11-07 NOTE — Progress Notes (Signed)
[name] ([id number]) Visit Report for 11/01/2017 Chief Complaint Document Details Patient Name: [name], [name]. Date of Service: 11/01/2017 2:15 PM Medical Record Number: [id number] Patient Account Number: [id number] Date of Birth/Sex: 21-Dec-1928 (84 y.o. F) Treating RN: Curtis Sites Primary Care Provider: Lois Huxley Other Clinician: Referring Provider: Lois Huxley Treating Provider/Extender: Linwood Dibbles, Naylin Burkle Weeks in Treatment: 6 Information Obtained from: Patient Chief Complaint She is here for evaluation of a sacrococcygeal pressure ulcer Electronic Signature(s) Signed: 11/05/2017 5:56:33 PM By: Lenda Kelp PA-C Entered By: Lenda Kelp on 11/01/2017 14:01:53 Sohn, [name] ([id number]) -------------------------------------------------------------------------------- HPI Details Patient Name: [name] Date of Service: 11/01/2017 2:15 PM Medical Record Number: [id number] Patient Account Number: [id number] Date of Birth/Sex: May 27, 1929 (84 y.o. F) Treating RN: Curtis Sites Primary Care Provider: Lois Huxley Other Clinician: Referring Provider: Lois Huxley Treating Provider/Extender: Linwood Dibbles, Leahmarie Gasiorowski Weeks in Treatment: 6 History of Present Illness HPI Description: 09/19/17-She is here for initial evaluation of the sacrococcygeal pressure ulcer. She states she was hospitalized for 1 month December-January in [place]; she was out of town for her brother's funeral at the time. While hospitalized she developed a sacral ulcer and was discharged to [facility] and [place]. She is nonambulatory, Hoyer lift dependent. She states she is repositioned while in bed but spends approximately 5-8 hours a day in the wheelchair. She does admit to pain with sitting. She denies being on any antibiotic therapy or any specific evaluation for her sacral ulcer since arriving to rehabilitation. She has a listed allergy to lidocaine, but states this was a  combination medication of Aspercreme with lidocaine that she had a reaction to. She tolerated debridement of nonviable tissue today, there is no overt sign of infection and I expect a swab culture would produce a polymicrobial result which may not reflect an acute infection. We will treat with Dakin's wet-to-dry daily and she will follow-up next week. 09/26/17-She is here for evaluation for stage IV sacral pressure ulcer. Mobile x-ray was performed at her facility and the conclusion reads findings suspicious for osteomyelitis involving the inferior sacrum; we will initiate her on doxycycline and refer her to infectious disease for their input. She had a pre-albumin drawn, level 7; referral for facility dietitian to re-evaluate. There is improvement in amount of non-viable tissue, granulation tissue present; will continue with same treatment plan and follow up next week. 10/04/17 on evaluation today patient presents for follow-up concerning her sacral ulcer. She continues to experience discomfort although we did actually attempt been the cane spray today after discussing this with patient and her son and she seems to have tolerated this very well without any complication. There was no evidence erythema, the pain was improved, and in general I was pleased with this fact. The patient continues to be on doxycycline at this point she is going to be seeing that to [facility] we're not exactly sure when that appointment is we will check on that today. Otherwise the patient seems to be tolerating the dressing changes fairly well her son does state she had a difficult ride here today she does have a new Roho cushion he's unsure if this somehow has affected things and made it worse. 10/18/17 on evaluation today patient appears to be doing rather well in regard to her sacral wound. She has been tolerating the dressing changes without complication. With that being said she has no evidence of infection there still  some Slough noted currently on the surface of the wound  special at the 12 o'clock location which did require sharp debridement today. She tolerated this without complication at this point. Overall I am happy with how things seem to be progressing. 10/25/17 on evaluation today patient sacral wound actually appears to be doing better there is a little bit of slough still noted in the base of the wound so I believe we will likely be able to completely debride this away today. I feel she's at the point where a Wound VAC would be appropriate. Fortunately she's not having any significant pain. 11/01/17 on evaluation today patient appears to be doing rather well in regard to her sacral ulcer. The one thing of note is that at the facility they told her she cannot sit due to the Wound VAC because their placements directly over the winter site. With that being said they just need to bridge this to another site and this is something that we're gonna work on getting the facility trained on how to properly bridge the VAC. Otherwise she seems to be doing well in my opinion. Electronic Signature(s) Signed: 11/05/2017 5:56:33 PM By: Lenda Kelp PA-C Entered By: Lenda Kelp on 11/01/2017 15:26:41 Calzada, [name] ([id number]) -------------------------------------------------------------------------------- Physical Exam Details Patient Name: [name] Date of Service: 11/01/2017 2:15 PM Medical Record Number: [id number] Patient Account Number: [id number] Date of Birth/Sex: December 25, 1928 (84 y.o. F) Treating RN: Curtis Sites Primary Care Provider: Lois Huxley Other Clinician: Referring Provider: Lois Huxley Treating Provider/Extender: STONE III, Nicie Milan Weeks in Treatment: 6 Constitutional Well-nourished and well-hydrated in no acute distress. Respiratory normal breathing without difficulty. clear to auscultation bilaterally. Cardiovascular regular rate and rhythm with normal S1, S2. Psychiatric this  patient is able to make decisions and demonstrates good insight into disease process. Alert and Oriented x 3. pleasant and cooperative. Notes Patients word actually appears to be fairly clean during evaluation today and she shows good signs of granulation which is good news as well. Fortunately there is no evidence of infection. No sharp debridement was required today. Electronic Signature(s) Signed: 11/05/2017 5:56:33 PM By: Lenda Kelp PA-C Entered By: Lenda Kelp on 11/01/2017 15:28:07 [name] ([id number]) -------------------------------------------------------------------------------- Physician Orders Details Patient Name: MENDE, BISWELL Date of Service: 11/01/2017 2:15 PM Medical Record Number: [id number] Patient Account Number: [id number] Date of Birth/Sex: 12/12/1928 (84 y.o. F) Treating RN: Curtis Sites Primary Care Provider: Lois Huxley Other Clinician: Referring Provider: Lois Huxley Treating Provider/Extender: Linwood Dibbles, Seira Cody Weeks in Treatment: 6 Verbal / Phone Orders: No Diagnosis Coding ICD-10 Coding Code Description L89.154 Pressure ulcer of sacral region, stage 4 Z99.3 Dependence on wheelchair R54 Age-related physical debility M46.28 Osteomyelitis of vertebra, sacral and sacrococcygeal region E43 Unspecified severe protein-calorie malnutrition Wound Cleansing Wound #1 Sacrum o Clean wound with Normal Saline. Anesthetic (add to Medication List) Wound #1 Sacrum o Benzocaine Topical Anesthetic Spray applied to wound bed prior to debridement (In Clinic Only). Skin Barriers/Peri-Wound Care Wound #1 Sacrum o Skin Prep Primary Wound Dressing Wound #1 Sacrum o Other: - Dakin's Solution moisten gauze - in office today Secondary Dressing Wound #1 Sacrum o Dry Gauze o Boardered Foam Dressing Dressing Change Frequency Wound #1 Sacrum o Change Dressing Monday, Wednesday, Friday - NPWT to be changed Monday, Wednesday and  Fridays Follow-up Appointments Wound #1 Sacrum o Return Appointment in 1 week. Off-Loading Wound #1 Sacrum o Mattress - Group II Hole, Cariann J. ([id number]) o Turn and reposition every 2 hours Additional Orders / Instructions Wound #1 Sacrum o Increase protein intake.   o Other: - Vitamin C, Zinc Dietitian Evaluation for protein intake adjustments Negative Pressure Wound Therapy Wound #1 Sacrum o Wound VAC settings at 125/130 mmHg continuous pressure. Use BLACK/GREEN foam to wound cavity. Use WHITE foam to fill any tunnel/s and/or undermining. Change VAC dressing 3 X WEEK. Change canister as indicated when full. Nurse may titrate settings and frequency of dressing changes as clinically indicated. - PLEASE DRAPE AROUND WOUND WITH NPWT DRAPE TO PROTECT PERIWOUND SKIN - SNF TO PROVIDE NPWT FOR PATIENT AND INITIATE WHEN AVAILABLE Electronic Signature(s) Signed: 11/01/2017 3:23:13 PM By: Curtis Sites Signed: 11/05/2017 5:56:33 PM By: Lenda Kelp PA-C Entered By: Curtis Sites on 11/01/2017 15:23:13 John, [name] ([id number]) -------------------------------------------------------------------------------- Problem List Details Patient Name: MADYLIN, FAIRBANK. Date of Service: 11/01/2017 2:15 PM Medical Record Number: [id number] Patient Account Number: [id number] Date of Birth/Sex: 29-Apr-1929 (84 y.o. F) Treating RN: Curtis Sites Primary Care Provider: Lois Huxley Other Clinician: Referring Provider: Lois Huxley Treating Provider/Extender: Linwood Dibbles, Traveon Louro Weeks in Treatment: 6 Active Problems ICD-10 Impacting Encounter Code Description Active Date Wound Healing Diagnosis L89.154 Pressure ulcer of sacral region, stage 4 09/19/2017 Yes Z99.3 Dependence on wheelchair 09/19/2017 Yes R54 Age-related physical debility 09/19/2017 Yes M46.28 Osteomyelitis of vertebra, sacral and sacrococcygeal region 09/26/2017 Yes E43 Unspecified severe protein-calorie malnutrition 09/26/2017  Yes Inactive Problems Resolved Problems Electronic Signature(s) Signed: 11/05/2017 5:56:33 PM By: Lenda Kelp PA-C Entered By: Lenda Kelp on 11/01/2017 14:01:38 Norby, [name] ([id number]) -------------------------------------------------------------------------------- Progress Note Details Patient Name: [name]. Date of Service: 11/01/2017 2:15 PM Medical Record Number: [id number] Patient Account Number: [id number] Date of Birth/Sex: 12-Sep-1928 (84 y.o. F) Treating RN: Curtis Sites Primary Care Provider: Lois Huxley Other Clinician: Referring Provider: Lois Huxley Treating Provider/Extender: Linwood Dibbles, Haylin Camilli Weeks in Treatment: 6 Subjective Chief Complaint Information obtained from Patient She is here for evaluation of a sacrococcygeal pressure ulcer History of Present Illness (HPI) 09/19/17-She is here for initial evaluation of the sacrococcygeal pressure ulcer. She states she was hospitalized for 1 month December-January in [place]; she was out of town for her brother's funeral at the time. While hospitalized she developed a sacral ulcer and was discharged to [facility] and [place]. She is nonambulatory, Hoyer lift dependent. She states she is repositioned while in bed but spends approximately 5-8 hours a day in the wheelchair. She does admit to pain with sitting. She denies being on any antibiotic therapy or any specific evaluation for her sacral ulcer since arriving to rehabilitation. She has a listed allergy to lidocaine, but states this was a combination medication of Aspercreme with lidocaine that she had a reaction to. She tolerated debridement of nonviable tissue today, there is no overt sign of infection and I expect a swab culture would produce a polymicrobial result which may not reflect an acute infection. We will treat with Dakin's wet-to-dry daily and she will follow-up next week. 09/26/17-She is here for evaluation for stage IV sacral  pressure ulcer. Mobile x-ray was performed at her facility and the conclusion reads findings suspicious for osteomyelitis involving the inferior sacrum; we will initiate her on doxycycline and refer her to infectious disease for their input. She had a pre-albumin drawn, level 7; referral for facility dietitian to re-evaluate. There is improvement in amount of non-viable tissue, granulation tissue present; will continue with same treatment plan and follow up next week. 10/04/17 on evaluation today patient presents for follow-up concerning her sacral ulcer. She continues to experience discomfort although we did actually  attempt been the cane spray today after discussing this with patient and her son and she seems to have tolerated this very well without any complication. There was no evidence erythema, the pain was improved, and in general I was pleased with this fact. The patient continues to be on doxycycline at this point she is going to be seeing that to [facility] we're not exactly sure when that appointment is we will check on that today. Otherwise the patient seems to be tolerating the dressing changes fairly well her son does state she had a difficult ride here today she does have a new Roho cushion he's unsure if this somehow has affected things and made it worse. 10/18/17 on evaluation today patient appears to be doing rather well in regard to her sacral wound. She has been tolerating the dressing changes without complication. With that being said she has no evidence of infection there still some Slough noted currently on the surface of the wound special at the 12 o'clock location which did require sharp debridement today. She tolerated this without complication at this point. Overall I am happy with how things seem to be progressing. 10/25/17 on evaluation today patient sacral wound actually appears to be doing better there is a little bit of slough still noted in the base of the wound so I  believe we will likely be able to completely debride this away today. I feel she's at the point where a Wound VAC would be appropriate. Fortunately she's not having any significant pain. 11/01/17 on evaluation today patient appears to be doing rather well in regard to her sacral ulcer. The one thing of note is that at the facility they told her she cannot sit due to the Wound VAC because their placements directly over the winter site. With that being said they just need to bridge this to another site and this is something that we're gonna work on getting the facility trained on how to properly bridge the VAC. Otherwise she seems to be doing well in my opinion. Patient History Information obtained from Patient. SOLENNE, COLTHARP ([id number]) Family History Cancer - Child, No family history of Diabetes, Heart Disease, Hereditary Spherocytosis, Hypertension, Kidney Disease, Lung Disease, Seizures, Stroke, Thyroid Problems, Tuberculosis. Social History Never smoker, Marital Status - Widowed, Alcohol Use - Never, Drug Use - No History, Caffeine Use - Rarely. Medical And Surgical History Notes Cardiovascular hyperlipidemia Gastrointestinal hernia repair December 2018, colostomy Oncologic rectal cancer 20 years ago Review of Systems (ROS) Constitutional Symptoms (General Health) Denies complaints or symptoms of Fever, Chills. Respiratory The patient has no complaints or symptoms. Cardiovascular The patient has no complaints or symptoms. Psychiatric The patient has no complaints or symptoms. Objective Constitutional Well-nourished and well-hydrated in no acute distress. Vitals Time Taken: 2:30 PM, Height: 61 in, Temperature: 98.3 F, Pulse: 73 bpm, Respiratory Rate: 18 breaths/min, Blood Pressure: 110/51 mmHg. Respiratory normal breathing without difficulty. clear to auscultation bilaterally. Cardiovascular regular rate and rhythm with normal S1, S2. Psychiatric this patient is able to  make decisions and demonstrates good insight into disease process. Alert and Oriented x 3. pleasant and cooperative. General Notes: Patients word actually appears to be fairly clean during evaluation today and she shows good signs of granulation which is good news as well. Fortunately there is no evidence of infection. No sharp debridement was required today. Integumentary (Hair, Skin) Marinaccio, Diamante J. ([id number]) Wound #1 status is Open. Original cause of wound was Pressure Injury. The wound is located on the  Sacrum. The wound measures 4.5cm length x 4cm width x 1cm depth; 14.137cm^2 area and 14.137cm^3 volume. There is bone and Fat Layer (Subcutaneous Tissue) Exposed exposed. There is undermining starting at 12:00 and ending at 12:00 with a maximum distance of 2.8cm. There is a large amount of serous drainage noted. The wound margin is flat and intact. There is large (67- 100%) pink granulation within the wound bed. There is a small (1-33%) amount of necrotic tissue within the wound bed including Adherent Slough. The periwound skin appearance exhibited: Scarring. The periwound skin appearance did not exhibit: Callus, Crepitus, Excoriation, Induration, Rash, Dry/Scaly, Maceration, Atrophie Blanche, Cyanosis, Ecchymosis, Hemosiderin Staining, Mottled, Pallor, Rubor, Erythema. Periwound temperature was noted as No Abnormality. The periwound has tenderness on palpation. Assessment Active Problems ICD-10 L89.154 - Pressure ulcer of sacral region, stage 4 Z99.3 - Dependence on wheelchair R54 - Age-related physical debility M46.28 - Osteomyelitis of vertebra, sacral and sacrococcygeal region E43 - Unspecified severe protein-calorie malnutrition Plan Wound Cleansing: Wound #1 Sacrum: Clean wound with Normal Saline. Anesthetic (add to Medication List): Wound #1 Sacrum: Benzocaine Topical Anesthetic Spray applied to wound bed prior to debridement (In Clinic Only). Skin Barriers/Peri-Wound  Care: Wound #1 Sacrum: Skin Prep Primary Wound Dressing: Wound #1 Sacrum: Other: - Dakin's Solution moisten gauze - in office today Secondary Dressing: Wound #1 Sacrum: Dry Gauze Boardered Foam Dressing Dressing Change Frequency: Wound #1 Sacrum: Change Dressing Monday, Wednesday, Friday - NPWT to be changed Monday, Wednesday and Fridays Follow-up Appointments: Wound #1 Sacrum: Return Appointment in 1 week. Off-Loading: Wound #1 Sacrum: Mattress - Group II Turn and reposition every 2 hours Additional Orders / InstructionsSAJA, BARTOLINI ([id number]) Wound #1 Sacrum: Increase protein intake. Other: - Vitamin C, Zinc Dietitian Evaluation for protein intake adjustments Negative Pressure Wound Therapy: Wound #1 Sacrum: Wound VAC settings at 125/130 mmHg continuous pressure. Use BLACK/GREEN foam to wound cavity. Use WHITE foam to fill any tunnel/s and/or undermining. Change VAC dressing 3 X WEEK. Change canister as indicated when full. Nurse may titrate settings and frequency of dressing changes as clinically indicated. - PLEASE DRAPE AROUND WOUND WITH NPWT DRAPE TO PROTECT PERIWOUND SKIN - SNF TO PROVIDE NPWT FOR PATIENT AND INITIATE WHEN AVAILABLE I'm gonna suggest that we continue with the Wound VAC although I would like this to be bridged off to the side in order to allow for proper drainage without including the Wound VAC tubing. This will allow the patient to be able to set up and not have to be number one transfer by EMS to the facility here but also she will be more comfortable just on a day-to-day basis. We will see where things stand in one weeks time when we see her for evaluation to ensure everything is going well. If things are doing well then we will subsequently switch to two week follow-up visits. Please see above for specific wound care orders. We will see patient for re-evaluation in 1 week(s) here in the clinic. If anything worsens or changes patient will contact  our office for additional recommendations. Electronic Signature(s) Signed: 11/05/2017 5:56:33 PM By: Lenda Kelp PA-C Entered By: Lenda Kelp on 11/01/2017 15:28:47 [name] ([id number]) -------------------------------------------------------------------------------- ROS/PFSH Details Patient Name: SHAQUIA, BERKLEY Date of Service: 11/01/2017 2:15 PM Medical Record Number: [id number] Patient Account Number: [id number] Date of Birth/Sex: Aug 04, 1928 (84 y.o. F) Treating RN: Curtis Sites Primary Care Provider: Lois Huxley Other Clinician: Referring Provider: Lois Huxley Treating Provider/Extender: STONE III, Khaalid Lefkowitz Weeks in Treatment:  6 Information Obtained From Patient Wound History Do you currently have one or more open woundso Yes How many open wounds do you currently haveo 1 Approximately how long have you had your woundso since December How have you been treating your wound(s) until nowo dakins Has your wound(s) ever healed and then re-openedo No Have you had any lab work done in the past montho No Have you tested positive for an antibiotic resistant organism (MRSA, VRE)o No Have you tested positive for osteomyelitis (bone infection)o No Have you had any tests for circulation on your legso No Constitutional Symptoms (General Health) Complaints and Symptoms: Negative for: Fever; Chills Eyes Medical History: Positive for: Cataracts - removed Hematologic/Lymphatic Medical History: Positive for: Anemia Respiratory Complaints and Symptoms: No Complaints or Symptoms Medical History: Negative for: Aspiration; Asthma; Chronic Obstructive Pulmonary Disease (COPD); Pneumothorax; Sleep Apnea; Tuberculosis Cardiovascular Complaints and Symptoms: No Complaints or Symptoms Medical History: Positive for: Hypertension Negative for: Angina; Arrhythmia; Congestive Heart Failure; Coronary Artery Disease; Deep Vein Thrombosis; Hypotension; Myocardial Infarction; Peripheral  Arterial Disease; Peripheral Venous Disease; Phlebitis; Vasculitis Past Medical History Notes: hyperlipidemia TESS, POTTS ([id number]) Gastrointestinal Medical History: Negative for: Cirrhosis ; Colitis; Crohnos; Hepatitis A; Hepatitis B; Hepatitis C Past Medical History Notes: hernia repair December 2018, colostomy Endocrine Medical History: Positive for: Type II Diabetes Immunological Medical History: Negative for: Lupus Erythematosus; Raynaudos Integumentary (Skin) Medical History: Negative for: History of Burn; History of pressure wounds Musculoskeletal Medical History: Positive for: Rheumatoid Arthritis; Osteoarthritis Neurologic Medical History: Negative for: Dementia; Neuropathy Oncologic Medical History: Positive for: Received Chemotherapy; Received Radiation Past Medical History Notes: rectal cancer 20 years ago Psychiatric Complaints and Symptoms: No Complaints or Symptoms HBO Extended History Items Eyes: Cataracts Immunizations Pneumococcal Vaccine: Received Pneumococcal Vaccination: Yes Immunization Notes: up to date Implantable Devices Family and Social History Cancer: Yes - Child; Diabetes: No; Heart Disease: No; Hereditary Spherocytosis: No; Hypertension: No; Kidney Disease: No; Lung Disease: No; Seizures: No; Stroke: No; Thyroid Problems: No; Tuberculosis: No; Never smoker; Marital Status - GRAVES, Kimiyo J. ([id number]) Widowed; Alcohol Use: Never; Drug Use: No History; Caffeine Use: Rarely; Financial Concerns: No; Food, Clothing or Shelter Needs: No; Support System Lacking: No; Transportation Concerns: No; Advanced Directives: No; Patient does not want information on Advanced Directives Physician Affirmation I have reviewed and agree with the above information. Electronic Signature(s) Signed: 11/01/2017 4:48:08 PM By: Curtis Sites Signed: 11/05/2017 5:56:33 PM By: Lenda Kelp PA-C Entered By: Lenda Kelp on 11/01/2017 15:27:20 [place],  [name] ([id number]) -------------------------------------------------------------------------------- SuperBill Details Patient Name: SORAH, FALKENSTEIN Date of Service: 11/01/2017 Medical Record Number: [id number] Patient Account Number: [id number] Date of Birth/Sex: 1928/10/18 (84 y.o. F) Treating RN: Curtis Sites Primary Care Provider: Lois Huxley Other Clinician: Referring Provider: Lois Huxley Treating Provider/Extender: Linwood Dibbles, Jyasia Markoff Weeks in Treatment: 6 Diagnosis Coding ICD-10 Codes Code Description L89.154 Pressure ulcer of sacral region, stage 4 Z99.3 Dependence on wheelchair R54 Age-related physical debility M46.28 Osteomyelitis of vertebra, sacral and sacrococcygeal region E43 Unspecified severe protein-calorie malnutrition Facility Procedures CPT4 Code: [id number] Description: 99213 - WOUND CARE VISIT-LEV 3 EST PT Modifier: Quantity: 1 Physician Procedures CPT4 Code: 0102725 Description: 99213 - WC PHYS LEVEL 3 - EST PT ICD-10 Diagnosis Description L89.154 Pressure ulcer of sacral region, stage 4 Z99.3 Dependence on wheelchair R54 Age-related physical debility M46.28 Osteomyelitis of vertebra, sacral and sacrococcygeal reg Modifier: ion Quantity: 1 Electronic Signature(s) Signed: 11/05/2017 5:56:33 PM By: Lenda Kelp PA-C Entered By: Lenda Kelp on 11/01/2017 15:29:00

## 2017-11-07 NOTE — Progress Notes (Signed)
[name], [name] ([id number]) Visit Report for 11/01/2017 Arrival Information Details Patient Name: [name], [name]. Date of Service: 11/01/2017 2:15 PM Medical Record Number: [id number] Patient Account Number: [id number] Date of Birth/Sex: [birth year] (84 y.o. F) Treating RN: Curtis Sites Primary Care Nariyah Osias: Lois Huxley Other Clinician: Referring Kiearra Oyervides: Lois Huxley Treating Lurae Hornbrook/Extender: Linwood Dibbles, HOYT Weeks in Treatment: 6 Visit Information History Since Last Visit Added or deleted any medications: No Patient Arrived: Stretcher Any new allergies or adverse reactions: No Arrival Time: 14:34 Had a fall or experienced change in No Accompanied By: EMS activities of daily living that may affect Transfer Assistance: Manual risk of falls: Patient Identification Verified: Yes Signs or symptoms of abuse/neglect since last visito No Secondary Verification Process Completed: Yes Hospitalized since last visit: No Patient Has Alerts: Yes Implantable device outside of the clinic excluding No Patient Alerts: DMII cellular tissue based products placed in the center NO LIDOCAINE since last visit: Has Dressing in Place as Prescribed: Yes Pain Present Now: No Electronic Signature(s) Signed: 11/01/2017 3:42:09 PM By: Dayton Martes RCP, RRT, CHT Entered By: Dayton Martes on 11/01/2017 14:35:44 [name], [name] ([id number]) -------------------------------------------------------------------------------- Clinic Level of Care Assessment Details Patient Name: [name]. Date of Service: 11/01/2017 2:15 PM Medical Record Number: [id number] Patient Account Number: [id number] Date of Birth/Sex: 25-Dec-1928 (84 y.o. F) Treating RN: Curtis Sites Primary Care Zyshonne Malecha: Lois Huxley Other Clinician: Referring Velita Quirk: Lois Huxley Treating Arraya Buck/Extender: Linwood Dibbles, HOYT Weeks in Treatment: 6 Clinic Level of Care Assessment Items TOOL 4 Quantity Score []   - Use when only an EandM is performed on FOLLOW-UP visit 0 ASSESSMENTS - Nursing Assessment / Reassessment X - Reassessment of Co-morbidities (includes updates in patient status) 1 10 X- 1 5 Reassessment of Adherence to Treatment Plan ASSESSMENTS - Wound and Skin Assessment / Reassessment X - Simple Wound Assessment / Reassessment - one wound 1 5 []  - 0 Complex Wound Assessment / Reassessment - multiple wounds []  - 0 Dermatologic / Skin Assessment (not related to wound area) ASSESSMENTS - Focused Assessment []  - Circumferential Edema Measurements - multi extremities 0 []  - 0 Nutritional Assessment / Counseling / Intervention []  - 0 Lower Extremity Assessment (monofilament, tuning fork, pulses) []  - 0 Peripheral Arterial Disease Assessment (using hand held doppler) ASSESSMENTS - Ostomy and/or Continence Assessment and Care []  - Incontinence Assessment and Management 0 []  - 0 Ostomy Care Assessment and Management (repouching, etc.) PROCESS - Coordination of Care X - Simple Patient / Family Education for ongoing care 1 15 []  - 0 Complex (extensive) Patient / Family Education for ongoing care []  - 0 Staff obtains Chiropractor, Records, Test Results / Process Orders []  - 0 Staff telephones HHA, Nursing Homes / Clarify orders / etc []  - 0 Routine Transfer to another Facility (non-emergent condition) []  - 0 Routine Hospital Admission (non-emergent condition) []  - 0 New Admissions / Manufacturing engineer / Ordering NPWT, Apligraf, etc. []  - 0 Emergency Hospital Admission (emergent condition) X- 1 10 Simple Discharge Coordination [name], STOY. ([id number]) []  - 0 Complex (extensive) Discharge Coordination PROCESS - Special Needs []  - Pediatric / Minor Patient Management 0 []  - 0 Isolation Patient Management []  - 0 Hearing / Language / Visual special needs []  - 0 Assessment of Community assistance (transportation, D/C planning, etc.) []  - 0 Additional assistance / Altered  mentation []  - 0 Support Surface(s) Assessment (bed, cushion, seat, etc.) INTERVENTIONS - Wound Cleansing / Measurement X - Simple Wound Cleansing - one wound 1 5 []  -  0 Complex Wound Cleansing - multiple wounds X- 1 5 Wound Imaging (photographs - any number of wounds) []  - 0 Wound Tracing (instead of photographs) X- 1 5 Simple Wound Measurement - one wound []  - 0 Complex Wound Measurement - multiple wounds INTERVENTIONS - Wound Dressings X - Small Wound Dressing one or multiple wounds 1 10 []  - 0 Medium Wound Dressing one or multiple wounds []  - 0 Large Wound Dressing one or multiple wounds X- 1 5 Application of Medications - topical []  - 0 Application of Medications - injection INTERVENTIONS - Miscellaneous []  - External ear exam 0 []  - 0 Specimen Collection (cultures, biopsies, blood, body fluids, etc.) []  - 0 Specimen(s) / Culture(s) sent or taken to Lab for analysis []  - 0 Patient Transfer (multiple staff / Nurse, adult / Similar devices) []  - 0 Simple Staple / Suture removal (25 or less) []  - 0 Complex Staple / Suture removal (26 or more) []  - 0 Hypo / Hyperglycemic Management (close monitor of Blood Glucose) []  - 0 Ankle / Brachial Index (ABI) - do not check if billed separately X- 1 5 Vital Signs [name], [name] J. ([id number]) Has the patient been seen at the hospital within the last three years: Yes Total Score: 80 Level Of Care: New/Established - Level 3 Electronic Signature(s) Signed: 11/01/2017 4:48:08 PM By: Curtis Sites Entered By: Curtis Sites on 11/01/2017 15:24:37 [name] ([id number]) -------------------------------------------------------------------------------- Encounter Discharge Information Details Patient Name: [name], [name]. Date of Service: 11/01/2017 2:15 PM Medical Record Number: [id number] Patient Account Number: [id number] Date of Birth/Sex: 12-24-1928 (84 y.o. F) Treating RN: Curtis Sites Primary Care Carnetta Losada: Lois Huxley  Other Clinician: Referring Khadeejah Castner: Lois Huxley Treating Jihad Brownlow/Extender: Linwood Dibbles, HOYT Weeks in Treatment: 6 Encounter Discharge Information Items Discharge Pain Level: 0 Discharge Condition: Stable Ambulatory Status: Stretcher Discharge Destination: Nursing Home Transportation: Ambulance Accompanied By: self Schedule Follow-up Appointment: Yes Medication Reconciliation completed and No provided to Patient/Care Koi Zangara: Provided on Clinical Summary of Care: 11/01/2017 Form Type Recipient Paper Patient SM Electronic Signature(s) Signed: 11/01/2017 3:28:49 PM By: Alejandro Mulling Entered By: Alejandro Mulling on 11/01/2017 15:28:49 [name] ([id number]) -------------------------------------------------------------------------------- Lower Extremity Assessment Details Patient Name: [name]. Date of Service: 11/01/2017 2:15 PM Medical Record Number: [id number] Patient Account Number: [id number] Date of Birth/Sex: 1929/06/07 (84 y.o. F) Treating RN: Huel Coventry Primary Care Soila Printup: Lois Huxley Other Clinician: Referring Keylah Darwish: Lois Huxley Treating Zeda Gangwer/Extender: Skeet Simmer in Treatment: 6 Electronic Signature(s) Signed: 11/01/2017 5:40:02 PM By: Elliot Gurney, BSN, RN, CWS, Kim RN, BSN Entered By: Elliot Gurney, BSN, RN, CWS, Kim on 11/01/2017 14:45:47 [name] ([id number]) -------------------------------------------------------------------------------- Multi Wound Chart Details Patient Name: ROSIO, CUTHBERT. Date of Service: 11/01/2017 2:15 PM Medical Record Number: [id number] Patient Account Number: [id number] Date of Birth/Sex: 1929-06-09 (84 y.o. F) Treating RN: Curtis Sites Primary Care Rodriques Badie: Lois Huxley Other Clinician: Referring Shevon Sian: Lois Huxley Treating Brigitt Mcclish/Extender: STONE III, HOYT Weeks in Treatment: 6 Vital Signs Height(in): 61 Pulse(bpm): 73 Weight(lbs): Blood Pressure(mmHg): 110/51 Body Mass  Index(BMI): Temperature(F): 98.3 Respiratory Rate 18 (breaths/min): Photos: [1:No Photos] [N/A:N/A] Wound Location: [1:Sacrum] [N/A:N/A] Wounding Event: [1:Pressure Injury] [N/A:N/A] Primary Etiology: [1:Pressure Ulcer] [N/A:N/A] Comorbid History: [1:Cataracts, Anemia, Hypertension, Type II Diabetes, Rheumatoid Arthritis, Osteoarthritis, Received Chemotherapy, Received Radiation] [N/A:N/A] Date Acquired: [1:07/08/2017] [N/A:N/A] Weeks of Treatment: [1:6] [N/A:N/A] Wound Status: [1:Open] [N/A:N/A] Measurements L x W x D [1:4.5x4x1] [N/A:N/A] (cm) Area (cm) : [1:14.137] [N/A:N/A] Volume (cm) : [1:14.137] [N/A:N/A] % Reduction in Area: [1:-5.30%] [N/A:N/A] % Reduction  in Volume: [1:24.80%] [N/A:N/A] Starting Position 1 [1:12] (o'clock): Ending Position 1 [1:12] (o'clock): Maximum Distance 1 (cm): [1:2.8] Undermining: [1:Yes] [N/A:N/A] Classification: [1:Category/Stage IV] [N/A:N/A] Exudate Amount: [1:Large] [N/A:N/A] Exudate Type: [1:Serous] [N/A:N/A] Exudate Color: [1:amber] [N/A:N/A] Wound Margin: [1:Flat and Intact] [N/A:N/A] Granulation Amount: [1:Large (67-100%)] [N/A:N/A] Granulation Quality: [1:Pink] [N/A:N/A] Necrotic Amount: [1:Small (1-33%)] [N/A:N/A] Exposed Structures: [1:Fat Layer (Subcutaneous Tissue) Exposed: Yes Bone: Yes Fascia: No] [N/A:N/A] Tendon: No Muscle: No Joint: No Epithelialization: None N/A N/A Periwound Skin Texture: Scarring: Yes N/A N/A Excoriation: No Induration: No Callus: No Crepitus: No Rash: No Periwound Skin Moisture: Maceration: No N/A N/A Dry/Scaly: No Periwound Skin Color: Atrophie Blanche: No N/A N/A Cyanosis: No Ecchymosis: No Erythema: No Hemosiderin Staining: No Mottled: No Pallor: No Rubor: No Temperature: No Abnormality N/A N/A Tenderness on Palpation: Yes N/A N/A Wound Preparation: Ulcer Cleansing: N/A N/A Rinsed/Irrigated with Saline Topical Anesthetic Applied: None, Other: hurricaine spray Treatment  Notes Electronic Signature(s) Signed: 11/01/2017 3:21:49 PM By: Curtis Sites Entered By: Curtis Sites on 11/01/2017 15:21:49 [name] ([id number]) -------------------------------------------------------------------------------- Multi-Disciplinary Care Plan Details Patient Name: [name], BARROS. Date of Service: 11/01/2017 2:15 PM Medical Record Number: [id number] Patient Account Number: [id number] Date of Birth/Sex: May 16, 1929 (84 y.o. F) Treating RN: Curtis Sites Primary Care Ryszard Socarras: Lois Huxley Other Clinician: Referring Leevon Upperman: Lois Huxley Treating Devontre Siedschlag/Extender: Linwood Dibbles, HOYT Weeks in Treatment: 6 Active Inactive ` Abuse / Safety / Falls / Self Care Management Nursing Diagnoses: Potential for falls Goals: Patient will not experience any injury related to falls Date Initiated: 09/19/2017 Target Resolution Date: 01/04/2018 Goal Status: Active Interventions: Assess Activities of Daily Living upon admission and as needed Assess fall risk on admission and as needed Assess: immobility, friction, shearing, incontinence upon admission and as needed Assess impairment of mobility on admission and as needed per policy Notes: ` Nutrition Nursing Diagnoses: Imbalanced nutrition Potential for alteratiion in Nutrition/Potential for imbalanced nutrition Goals: Patient/caregiver agrees to and verbalizes understanding of need to use nutritional supplements and/or vitamins as prescribed Date Initiated: 09/19/2017 Target Resolution Date: 01/04/2018 Goal Status: Active Interventions: Assess patient nutrition upon admission and as needed per policy Notes: ` Orientation to the Wound Care Program Nursing Diagnoses: Knowledge deficit related to the wound healing center program Goals: Patient/caregiver will verbalize understanding of the Wound Healing Center Program [place], SHAUNDA [name] ([id number]) Date Initiated: 09/19/2017 Target Resolution Date: 10/05/2017 Goal Status:  Active Interventions: Provide education on orientation to the wound center Notes: ` Pain, Acute or Chronic Nursing Diagnoses: Pain, acute or chronic: actual or potential Potential alteration in comfort, pain Goals: Patient/caregiver will verbalize adequate pain control between visits Date Initiated: 09/19/2017 Target Resolution Date: 01/04/2018 Goal Status: Active Interventions: Complete pain assessment as per visit requirements Encourage patient to take pain medications as prescribed Notes: ` Wound/Skin Impairment Nursing Diagnoses: Impaired tissue integrity Knowledge deficit related to ulceration/compromised skin integrity Goals: Ulcer/skin breakdown will have a volume reduction of 80% by week 12 Date Initiated: 09/19/2017 Target Resolution Date: 01/04/2018 Goal Status: Active Interventions: Assess patient/caregiver ability to perform ulcer/skin care regimen upon admission and as needed Assess ulceration(s) every visit Notes: Electronic Signature(s) Signed: 11/01/2017 3:21:43 PM By: Curtis Sites Entered By: Curtis Sites on 11/01/2017 15:21:42 [name], [name] ([id number]) -------------------------------------------------------------------------------- Pain Assessment Details Patient Name: [name]. Date of Service: 11/01/2017 2:15 PM Medical Record Number: [id number] Patient Account Number: [id number] Date of Birth/Sex: April 01, 1929 (84 y.o. F) Treating RN: Curtis Sites Primary Care Ambreen Tufte: Lois Huxley Other Clinician: Referring Fredia Chittenden: Lois Huxley Treating Rhyker Silversmith/Extender: Larina Bras  III, HOYT Weeks in Treatment: 6 Active Problems Location of Pain Severity and Description of Pain Patient Has Paino Yes Site Locations Rate the pain. Current Pain Level: 4 Pain Management and Medication Current Pain Management: Electronic Signature(s) Signed: 11/01/2017 3:42:09 PM By: Dayton Martes RCP, RRT, CHT Signed: 11/01/2017 4:48:08 PM By: Curtis Sites Entered By: Dayton Martes on 11/01/2017 14:35:58 [name] ([id number]) -------------------------------------------------------------------------------- Patient/Caregiver Education Details Patient Name: [name], [name] Date of Service: 11/01/2017 2:15 PM Medical Record Number: [id number] Patient Account Number: [id number] Date of Birth/Gender: 24-Jul-1928 (84 y.o. F) Treating RN: Phillis Haggis Primary Care Physician: Lois Huxley Other Clinician: Referring Physician: Lois Huxley Treating Physician/Extender: Skeet Simmer in Treatment: 6 Education Assessment Education Provided To: Patient Education Topics Provided Wound/Skin Impairment: Handouts: Caring for Your Ulcer, Other: change dressing as ordered Methods: Demonstration, Explain/Verbal Responses: State content correctly Electronic Signature(s) Signed: 11/01/2017 4:40:49 PM By: Alejandro Mulling Entered By: Alejandro Mulling on 11/01/2017 15:29:05 [name], [name] ([id number]) -------------------------------------------------------------------------------- Wound Assessment Details Patient Name: [name]. Date of Service: 11/01/2017 2:15 PM Medical Record Number: [id number] Patient Account Number: [id number] Date of Birth/Sex: 02/01/1929 (84 y.o. F) Treating RN: Huel Coventry Primary Care Mako Pelfrey: Lois Huxley Other Clinician: Referring Shalik Sanfilippo: Lois Huxley Treating Ninette Cotta/Extender: Linwood Dibbles, HOYT Weeks in Treatment: 6 Wound Status Wound Number: 1 Primary Pressure Ulcer Etiology: Wound Location: Sacrum Wound Open Wounding Event: Pressure Injury Status: Date Acquired: 07/08/2017 Comorbid Cataracts, Anemia, Hypertension, Type II Weeks Of Treatment: 6 History: Diabetes, Rheumatoid Arthritis, Osteoarthritis, Clustered Wound: No Received Chemotherapy, Received Radiation Wound Measurements Length: (cm) 4.5 % Reduct Width: (cm) 4 % Reduct Depth: (cm) 1 Epitheli Area: (cm) 14.137  Undermi Volume: (cm) 14.137 Star Endin Maxim ion in Area: -5.3% ion in Volume: 24.8% alization: None ning: Yes ting Position (o'clock): 12 g Position (o'clock): 12 um Distance: (cm) 2.8 Wound Description Classification: Category/Stage IV Foul Odo Wound Margin: Flat and Intact Slough/F Exudate Amount: Large Exudate Type: Serous Exudate Color: amber r After Cleansing: No ibrino No Wound Bed Granulation Amount: Large (67-100%) Exposed Structure Granulation Quality: Pink Fascia Exposed: No Necrotic Amount: Small (1-33%) Fat Layer (Subcutaneous Tissue) Exposed: Yes Necrotic Quality: Adherent Slough Tendon Exposed: No Muscle Exposed: No Joint Exposed: No Bone Exposed: Yes Periwound Skin Texture Texture Color No Abnormalities Noted: No No Abnormalities Noted: No Callus: No Atrophie Blanche: No Crepitus: No Cyanosis: No Excoriation: No Ecchymosis: No Induration: No Erythema: No Rash: No Hemosiderin Staining: No Scarring: Yes Mottled: No Pallor: No Moisture [name], [name] J. ([id number]) No Abnormalities Noted: No Rubor: No Dry / Scaly: No Temperature / Pain Maceration: No Temperature: No Abnormality Tenderness on Palpation: Yes Wound Preparation Ulcer Cleansing: Rinsed/Irrigated with Saline Topical Anesthetic Applied: None, Other: hurricaine spray, Treatment Notes Wound #1 (Sacrum) 1. Cleansed with: Clean wound with Normal Saline 2. Anesthetic Other (specify in notes) 3. Peri-wound Care: Skin Prep 5. Secondary Dressing Applied Bordered Foam Dressing Dry Gauze Notes dakins soaked gauze and covered with border foam gauze until pt returns to facility to have wound vac placed back on Electronic Signature(s) Signed: 11/01/2017 5:40:02 PM By: Elliot Gurney, BSN, RN, CWS, Kim RN, BSN Entered By: Elliot Gurney, BSN, RN, CWS, Kim on 11/01/2017 14:45:35 [name], [name] ([id number]) -------------------------------------------------------------------------------- Vitals  Details Patient Name: [name] Date of Service: 11/01/2017 2:15 PM Medical Record Number: [id number] Patient Account Number: [id number] Date of Birth/Sex: [birth year] (84 y.o. F) Treating RN: Curtis Sites Primary Care Chessa Barrasso: Lois Huxley Other Clinician: Referring Briea Mcenery: Lois Huxley Treating Leanor Voris/Extender:  STONE III, HOYT Weeks in Treatment: 6 Vital Signs Time Taken: 14:30 Temperature (F): 98.3 Height (in): 61 Pulse (bpm): 73 Respiratory Rate (breaths/min): 18 Blood Pressure (mmHg): 110/51 Reference Range: 80 - 120 mg / dl Electronic Signature(s) Signed: 11/01/2017 3:42:09 PM By: Dayton Martes RCP, RRT, CHT Entered By: Dayton Martes on 11/01/2017 14:36:36

## 2017-11-08 ENCOUNTER — Encounter: Payer: Medicare Other | Admitting: Physician Assistant

## 2017-11-08 DIAGNOSIS — L89154 Pressure ulcer of sacral region, stage 4: Secondary | ICD-10-CM | POA: Diagnosis not present

## 2017-11-14 NOTE — Progress Notes (Signed)
[name], [name] ([id number]) Visit Report for 11/08/2017 Chief Complaint Document Details Patient Name: [name], [name]. Date of Service: 11/08/2017 2:15 PM Medical Record Number: [id number] Patient Account Number: [id number] Date of Birth/Sex: 05/12/1929 (84 y.o. F) Treating RN: Curtis Sites Primary Care Provider: Lois Huxley Other Clinician: Referring Provider: Lois Huxley Treating Provider/Extender: Linwood Dibbles, Seona Clemenson Weeks in Treatment: 7 Information Obtained from: Patient Chief Complaint She is here for evaluation of a sacrococcygeal pressure ulcer Electronic Signature(s) Signed: 11/09/2017 10:27:37 AM By: Lenda Kelp PA-C Entered By: Lenda Kelp on 11/08/2017 14:34:35 [name], [name] ([id number]) -------------------------------------------------------------------------------- HPI Details Patient Name: [name] Date of Service: 11/08/2017 2:15 PM Medical Record Number: [id number] Patient Account Number: [id number] Date of Birth/Sex: 09/22/1928 (84 y.o. F) Treating RN: Curtis Sites Primary Care Provider: Lois Huxley Other Clinician: Referring Provider: Lois Huxley Treating Provider/Extender: Linwood Dibbles, Shritha Bresee Weeks in Treatment: 7 History of Present Illness HPI Description: 09/19/17-She is here for initial evaluation of the sacrococcygeal pressure ulcer. She states she was hospitalized for 1 month December-January in [place]; she was out of town for her brother's funeral at the time. While hospitalized she developed a sacral ulcer and was discharged to [facility] and [place]. She is nonambulatory, Hoyer lift dependent. She states she is repositioned while in bed but spends approximately 5-8 hours a day in the wheelchair. She does admit to pain with sitting. She denies being on any antibiotic therapy or any specific evaluation for her sacral ulcer since arriving to rehabilitation. She has a listed allergy to lidocaine, but states this was a  combination medication of Aspercreme with lidocaine that she had a reaction to. She tolerated debridement of nonviable tissue today, there is no overt sign of infection and I expect a swab culture would produce a polymicrobial result which may not reflect an acute infection. We will treat with Dakin's wet-to-dry daily and she will follow-up next week. 09/26/17-She is here for evaluation for stage IV sacral pressure ulcer. Mobile x-ray was performed at her facility and the conclusion reads findings suspicious for osteomyelitis involving the inferior sacrum; we will initiate her on doxycycline and refer her to infectious disease for their input. She had a pre-albumin drawn, level 7; referral for facility dietitian to re-evaluate. There is improvement in amount of non-viable tissue, granulation tissue present; will continue with same treatment plan and follow up next week. 10/04/17 on evaluation today patient presents for follow-up concerning her sacral ulcer. She continues to experience discomfort although we did actually attempt been the cane spray today after discussing this with patient and her son and she seems to have tolerated this very well without any complication. There was no evidence erythema, the pain was improved, and in general I was pleased with this fact. The patient continues to be on doxycycline at this point she is going to be seeing that to [facility] we're not exactly sure when that appointment is we will check on that today. Otherwise the patient seems to be tolerating the dressing changes fairly well her son does state she had a difficult ride here today she does have a new Roho cushion he's unsure if this somehow has affected things and made it worse. 10/18/17 on evaluation today patient appears to be doing rather well in regard to her sacral wound. She has been tolerating the dressing changes without complication. With that being said she has no evidence of infection there still  some Slough noted currently on the surface of the wound  special at the 12 o'clock location which did require sharp debridement today. She tolerated this without complication at this point. Overall I am happy with how things seem to be progressing. 10/25/17 on evaluation today patient sacral wound actually appears to be doing better there is a little bit of slough still noted in the base of the wound so I believe we will likely be able to completely debride this away today. I feel she's at the point where a Wound VAC would be appropriate. Fortunately she's not having any significant pain. 11/01/17 on evaluation today patient appears to be doing rather well in regard to her sacral ulcer. The one thing of note is that at the facility they told her she cannot sit due to the Wound VAC because their placements directly over the winter site. With that being said they just need to bridge this to another site and this is something that we're gonna work on getting the facility trained on how to properly bridge the VAC. Otherwise she seems to be doing well in my opinion. 11/08/17 on evaluation today patient appears to be doing well with the Wound VAC on evaluation. She has been tolerating the changes as far as the dressing is concerned without complication and overall now that the facility was shown how to properly bridge the [facility] we have done much better and is able to sit up at times as well. The wound itself looks very clean and is progressing nicely. Electronic Signature(s) Signed: 11/09/2017 10:27:37 AM By: Lenda Kelp PA-C Entered By: Lenda Kelp on 11/09/2017 10:06:01 [name] ([id number]) [address] [name] ([id number]) -------------------------------------------------------------------------------- Physical Exam Details Patient Name: [name]. Date of Service: 11/08/2017 2:15 PM Medical Record Number: [id number] Patient Account Number: [id number] Date of Birth/Sex: August 10, 1928 (84 y.o.  F) Treating RN: Curtis Sites Primary Care Provider: Lois Huxley Other Clinician: Referring Provider: Lois Huxley Treating Provider/Extender: STONE III, Shundra Wirsing Weeks in Treatment: 7 Constitutional Well-nourished and well-hydrated in no acute distress. Respiratory normal breathing without difficulty. clear to auscultation bilaterally. Cardiovascular regular rate and rhythm with normal S1, S2. Psychiatric this patient is able to make decisions and demonstrates good insight into disease process. Alert and Oriented x 3. pleasant and cooperative. Notes Currently patient's wound shows an excellent granulation surface and seems to be filling in very nicely. I think at this point the main thing we are gonna need is just to give this time to continue to heal and granulate in. Patient states she understands. Fortunately she's not having any significant discomfort which is good news. Electronic Signature(s) Signed: 11/09/2017 10:27:37 AM By: Lenda Kelp PA-C Entered By: Lenda Kelp on 11/09/2017 10:12:25 [name] ([id number]) -------------------------------------------------------------------------------- Physician Orders Details Patient Name: [name], [name] Date of Service: 11/08/2017 2:15 PM Medical Record Number: [id number] Patient Account Number: [id number] Date of Birth/Sex: 1928/09/12 (84 y.o. F) Treating RN: Curtis Sites Primary Care Provider: Lois Huxley Other Clinician: Referring Provider: Lois Huxley Treating Provider/Extender: Linwood Dibbles, Makayela Secrest Weeks in Treatment: 7 Verbal / Phone Orders: No Diagnosis Coding ICD-10 Coding Code Description L89.154 Pressure ulcer of sacral region, stage 4 Z99.3 Dependence on wheelchair R54 Age-related physical debility M46.28 Osteomyelitis of vertebra, sacral and sacrococcygeal region E43 Unspecified severe protein-calorie malnutrition Wound Cleansing Wound #1 Sacrum o Clean wound with Normal Saline. Anesthetic (add to  Medication List) Wound #1 Sacrum o Benzocaine Topical Anesthetic Spray applied to wound bed prior to debridement (In Clinic Only). Skin Barriers/Peri-Wound Care Wound #1 Sacrum o Skin  Prep Primary Wound Dressing Wound #1 Sacrum o Other: - Dakin's Solution moisten gauze - in office today Secondary Dressing Wound #1 Sacrum o Dry Gauze - in office today o Boardered Foam Dressing - in office today Dressing Change Frequency Wound #1 Sacrum o Change Dressing Monday, Wednesday, Friday - NPWT to be changed Monday, Wednesday and Fridays Follow-up Appointments Wound #1 Sacrum o Return Appointment in 1 week. Off-Loading Wound #1 Sacrum o Mattress - Group II Suhre, Armone[place] J. ([id number]) o Turn and reposition every 2 hours Additional Orders / Instructions Wound #1 Sacrum o Increase protein intake. o Other: - Vitamin C, Zinc Dietitian Evaluation for protein intake adjustments Negative Pressure Wound Therapy Wound #1 Sacrum o Wound VAC settings at 125/130 mmHg continuous pressure. Use BLACK/GREEN foam to wound cavity. Use WHITE foam to fill any tunnel/s and/or undermining. Change VAC dressing 3 X WEEK. Change canister as indicated when full. Nurse may titrate settings and frequency of dressing changes as clinically indicated. - PLEASE DRAPE AROUND WOUND WITH NPWT DRAPE TO PROTECT PERIWOUND SKIN - SNF TO PROVIDE NPWT FOR PATIENT AND INITIATE WHEN AVAILABLE Electronic Signature(s) Signed: 11/08/2017 4:42:41 PM By: Curtis Sites Signed: 11/09/2017 10:27:37 AM By: Lenda Kelp PA-C Entered By: Curtis Sites on 11/08/2017 15:34:27 Absher, [name] ([id number]) -------------------------------------------------------------------------------- Problem List Details Patient Name: [name], [name]. Date of Service: 11/08/2017 2:15 PM Medical Record Number: [id number] Patient Account Number: [id number] Date of Birth/Sex: 1929/02/26 (84 y.o. F) Treating RN: Curtis Sites Primary Care Provider: Lois Huxley Other Clinician: Referring Provider: Lois Huxley Treating Provider/Extender: Linwood Dibbles, Tyriana Helmkamp Weeks in Treatment: 7 Active Problems ICD-10 Impacting Encounter Code Description Active Date Wound Healing Diagnosis L89.154 Pressure ulcer of sacral region, stage 4 09/19/2017 Yes Z99.3 Dependence on wheelchair 09/19/2017 Yes R54 Age-related physical debility 09/19/2017 Yes M46.28 Osteomyelitis of vertebra, sacral and sacrococcygeal region 09/26/2017 Yes E43 Unspecified severe protein-calorie malnutrition 09/26/2017 Yes Inactive Problems Resolved Problems Electronic Signature(s) Signed: 11/09/2017 10:27:37 AM By: Lenda Kelp PA-C Entered By: Lenda Kelp on 11/08/2017 14:34:29 Auvil, [name] ([id number]) -------------------------------------------------------------------------------- Progress Note Details Patient Name: [name]. Date of Service: 11/08/2017 2:15 PM Medical Record Number: [id number] Patient Account Number: [id number] Date of Birth/Sex: September 09, 1928 (84 y.o. F) Treating RN: Curtis Sites Primary Care Provider: Lois Huxley Other Clinician: Referring Provider: Lois Huxley Treating Provider/Extender: Linwood Dibbles, Eboni Coval Weeks in Treatment: 7 Subjective Chief Complaint Information obtained from Patient She is here for evaluation of a sacrococcygeal pressure ulcer History of Present Illness (HPI) 09/19/17-She is here for initial evaluation of the sacrococcygeal pressure ulcer. She states she was hospitalized for 1 month December-January in [place]; she was out of town for her brother's funeral at the time. While hospitalized she developed a sacral ulcer and was discharged to [facility] and [place]. She is nonambulatory, Hoyer lift dependent. She states she is repositioned while in bed but spends approximately 5-8 hours a day in the wheelchair. She does admit to pain with sitting. She denies being on any  antibiotic therapy or any specific evaluation for her sacral ulcer since arriving to rehabilitation. She has a listed allergy to lidocaine, but states this was a combination medication of Aspercreme with lidocaine that she had a reaction to. She tolerated debridement of nonviable tissue today, there is no overt sign of infection and I expect a swab culture would produce a polymicrobial result which may not reflect an acute infection. We will treat with Dakin's wet-to-dry daily and she will follow-up next week.  09/26/17-She is here for evaluation for stage IV sacral pressure ulcer. Mobile x-ray was performed at her facility and the conclusion reads findings suspicious for osteomyelitis involving the inferior sacrum; we will initiate her on doxycycline and refer her to infectious disease for their input. She had a pre-albumin drawn, level 7; referral for facility dietitian to re-evaluate. There is improvement in amount of non-viable tissue, granulation tissue present; will continue with same treatment plan and follow up next week. 10/04/17 on evaluation today patient presents for follow-up concerning her sacral ulcer. She continues to experience discomfort although we did actually attempt been the cane spray today after discussing this with patient and her son and she seems to have tolerated this very well without any complication. There was no evidence erythema, the pain was improved, and in general I was pleased with this fact. The patient continues to be on doxycycline at this point she is going to be seeing that to [facility] we're not exactly sure when that appointment is we will check on that today. Otherwise the patient seems to be tolerating the dressing changes fairly well her son does state she had a difficult ride here today she does have a new Roho cushion he's unsure if this somehow has affected things and made it worse. 10/18/17 on evaluation today patient appears to be doing rather well in  regard to her sacral wound. She has been tolerating the dressing changes without complication. With that being said she has no evidence of infection there still some Slough noted currently on the surface of the wound special at the 12 o'clock location which did require sharp debridement today. She tolerated this without complication at this point. Overall I am happy with how things seem to be progressing. 10/25/17 on evaluation today patient sacral wound actually appears to be doing better there is a little bit of slough still noted in the base of the wound so I believe we will likely be able to completely debride this away today. I feel she's at the point where a Wound VAC would be appropriate. Fortunately she's not having any significant pain. 11/01/17 on evaluation today patient appears to be doing rather well in regard to her sacral ulcer. The one thing of note is that at the facility they told her she cannot sit due to the Wound VAC because their placements directly over the winter site. With that being said they just need to bridge this to another site and this is something that we're gonna work on getting the facility trained on how to properly bridge the VAC. Otherwise she seems to be doing well in my opinion. 11/08/17 on evaluation today patient appears to be doing well with the Wound VAC on evaluation. She has been tolerating the changes as far as the dressing is concerned without complication and overall now that the facility was shown how to properly bridge the [facility] we have done much better and is able to sit up at times as well. The wound itself looks very clean and is progressing nicely. [name], [name] ([id number]) Patient History Information obtained from Patient. Family History Cancer - Child, No family history of Diabetes, Heart Disease, Hereditary Spherocytosis, Hypertension, Kidney Disease, Lung Disease, Seizures, Stroke, Thyroid Problems, Tuberculosis. Social History Never  smoker, Marital Status - Widowed, Alcohol Use - Never, Drug Use - No History, Caffeine Use - Rarely. Medical And Surgical History Notes Cardiovascular hyperlipidemia Gastrointestinal hernia repair December 2018, colostomy Oncologic rectal cancer 20 years ago Review of Systems (ROS)  Constitutional Symptoms (General Health) Denies complaints or symptoms of Fever, Chills. Respiratory The patient has no complaints or symptoms. Cardiovascular The patient has no complaints or symptoms. Psychiatric The patient has no complaints or symptoms. Objective Constitutional Well-nourished and well-hydrated in no acute distress. Vitals Time Taken: 2:58 PM, Height: 61 in, Temperature: 98.3 F, Pulse: 73 bpm, Respiratory Rate: 18 breaths/min, Blood Pressure: 109/60 mmHg. Respiratory normal breathing without difficulty. clear to auscultation bilaterally. Cardiovascular regular rate and rhythm with normal S1, S2. Psychiatric this patient is able to make decisions and demonstrates good insight into disease process. Alert and Oriented x 3. pleasant and cooperative. [name], [name] Kitchen ([id number]) General Notes: Currently patient's wound shows an excellent granulation surface and seems to be filling in very nicely. I think at this point the main thing we are gonna need is just to give this time to continue to heal and granulate in. Patient states she understands. Fortunately she's not having any significant discomfort which is good news. Integumentary (Hair, Skin) Wound #1 status is Open. Original cause of wound was Pressure Injury. The wound is located on the Sacrum. The wound measures 4cm length x 5cm width x 1.2cm depth; 15.708cm^2 area and 18.85cm^3 volume. There is bone and Fat Layer (Subcutaneous Tissue) Exposed exposed. There is undermining starting at 1:00 and ending at 6:00 with a maximum distance of 1.9cm. There is a large amount of serosanguineous drainage noted. The wound margin is flat and  intact. There is large (67-100%) pink granulation within the wound bed. There is a small (1-33%) amount of necrotic tissue within the wound bed including Adherent Slough. The periwound skin appearance exhibited: Scarring. The periwound skin appearance did not exhibit: Callus, Crepitus, Excoriation, Induration, Rash, Dry/Scaly, Maceration, Atrophie Blanche, Cyanosis, Ecchymosis, Hemosiderin Staining, Mottled, Pallor, Rubor, Erythema. Periwound temperature was noted as No Abnormality. The periwound has tenderness on palpation. Assessment Active Problems ICD-10 L89.154 - Pressure ulcer of sacral region, stage 4 Z99.3 - Dependence on wheelchair R54 - Age-related physical debility M46.28 - Osteomyelitis of vertebra, sacral and sacrococcygeal region E43 - Unspecified severe protein-calorie malnutrition Plan Wound Cleansing: Wound #1 Sacrum: Clean wound with Normal Saline. Anesthetic (add to Medication List): Wound #1 Sacrum: Benzocaine Topical Anesthetic Spray applied to wound bed prior to debridement (In Clinic Only). Skin Barriers/Peri-Wound Care: Wound #1 Sacrum: Skin Prep Primary Wound Dressing: Wound #1 Sacrum: Other: - Dakin's Solution moisten gauze - in office today Secondary Dressing: Wound #1 Sacrum: Dry Gauze - in office today Boardered Foam Dressing - in office today Dressing Change Frequency: Wound #1 Sacrum: Change Dressing Monday, Wednesday, Friday - NPWT to be changed Monday, Wednesday and Fridays Follow-up Appointments: Wound #1 Sacrum: Return Appointment in 1 week. [place], Lucindy J. ([id number]) Off-Loading: Wound #1 Sacrum: Mattress - Group II Turn and reposition every 2 hours Additional Orders / Instructions: Wound #1 Sacrum: Increase protein intake. Other: - Vitamin C, Zinc Dietitian Evaluation for protein intake adjustments Negative Pressure Wound Therapy: Wound #1 Sacrum: Wound VAC settings at 125/130 mmHg continuous pressure. Use BLACK/GREEN foam to  wound cavity. Use WHITE foam to fill any tunnel/s and/or undermining. Change VAC dressing 3 X WEEK. Change canister as indicated when full. Nurse may titrate settings and frequency of dressing changes as clinically indicated. - PLEASE DRAPE AROUND WOUND WITH NPWT DRAPE TO PROTECT PERIWOUND SKIN - SNF TO PROVIDE NPWT FOR PATIENT AND INITIATE WHEN AVAILABLE I'm gonna suggest that we continue with the Current wound care measures for the next week. We will in fact see  her for reevaluation in two weeks time we see her for reevaluation. Please see above for specific wound care orders. We will see patient for re-evaluation in 1 week(s) here in the clinic. If anything worsens or changes patient will contact our office for additional recommendations. Electronic Signature(s) Signed: 11/09/2017 10:27:37 AM By: Lenda Kelp PA-C Entered By: Lenda Kelp on 11/09/2017 10:12:53 [name] ([id number]) -------------------------------------------------------------------------------- ROS/PFSH Details Patient Name: [name], [name] Date of Service: 11/08/2017 2:15 PM Medical Record Number: [id number] Patient Account Number: [id number] Date of Birth/Sex: [birth year] (84 y.o. F) Treating RN: Curtis Sites Primary Care Provider: Lois Huxley Other Clinician: Referring Provider: Lois Huxley Treating Provider/Extender: Linwood Dibbles, Thalya Fouche Weeks in Treatment: 7 Information Obtained From Patient Wound History Do you currently have one or more open woundso Yes How many open wounds do you currently haveo 1 Approximately how long have you had your woundso since December How have you been treating your wound(s) until nowo dakins Has your wound(s) ever healed and then re-openedo No Have you had any lab work done in the past montho No Have you tested positive for an antibiotic resistant organism (MRSA, VRE)o No Have you tested positive for osteomyelitis (bone infection)o No Have you had any tests for  circulation on your legso No Constitutional Symptoms (General Health) Complaints and Symptoms: Negative for: Fever; Chills Eyes Medical History: Positive for: Cataracts - removed Hematologic/Lymphatic Medical History: Positive for: Anemia Respiratory Complaints and Symptoms: No Complaints or Symptoms Medical History: Negative for: Aspiration; Asthma; Chronic Obstructive Pulmonary Disease (COPD); Pneumothorax; Sleep Apnea; Tuberculosis Cardiovascular Complaints and Symptoms: No Complaints or Symptoms Medical History: Positive for: Hypertension Negative for: Angina; Arrhythmia; Congestive Heart Failure; Coronary Artery Disease; Deep Vein Thrombosis; Hypotension; Myocardial Infarction; Peripheral Arterial Disease; Peripheral Venous Disease; Phlebitis; Vasculitis Past Medical History Notes: hyperlipidemia [name], [name] ([id number]) Gastrointestinal Medical History: Negative for: Cirrhosis ; Colitis; Crohnos; Hepatitis A; Hepatitis B; Hepatitis C Past Medical History Notes: hernia repair December 2018, colostomy Endocrine Medical History: Positive for: Type II Diabetes Immunological Medical History: Negative for: Lupus Erythematosus; Raynaudos Integumentary (Skin) Medical History: Negative for: History of Burn; History of pressure wounds Musculoskeletal Medical History: Positive for: Rheumatoid Arthritis; Osteoarthritis Neurologic Medical History: Negative for: Dementia; Neuropathy Oncologic Medical History: Positive for: Received Chemotherapy; Received Radiation Past Medical History Notes: rectal cancer 20 years ago Psychiatric Complaints and Symptoms: No Complaints or Symptoms HBO Extended History Items Eyes: Cataracts Immunizations Pneumococcal Vaccine: Received Pneumococcal Vaccination: Yes Immunization Notes: up to date Implantable Devices Family and Social History Cancer: Yes - Child; Diabetes: No; Heart Disease: No; Hereditary Spherocytosis: No;  Hypertension: No; Kidney Disease: No; Lung Disease: No; Seizures: No; Stroke: No; Thyroid Problems: No; Tuberculosis: No; Never smoker; Marital Status - [name], [name] J. ([id number]) Widowed; Alcohol Use: Never; Drug Use: No History; Caffeine Use: Rarely; Financial Concerns: No; Food, Clothing or Shelter Needs: No; Support System Lacking: No; Transportation Concerns: No; Advanced Directives: No; Patient does not want information on Advanced Directives Physician Affirmation I have reviewed and agree with the above information. Electronic Signature(s) Signed: 11/09/2017 10:27:37 AM By: Lenda Kelp PA-C Signed: 11/13/2017 4:31:04 PM By: Curtis Sites Entered By: Lenda Kelp on 11/09/2017 10:12:00 [name] ([id number]) -------------------------------------------------------------------------------- SuperBill Details Patient Name: [name], [name] Date of Service: 11/08/2017 Medical Record Number: [id number] Patient Account Number: [id number] Date of Birth/Sex: 07/06/1928 (84 y.o. F) Treating RN: Curtis Sites Primary Care Provider: Lois Huxley Other Clinician: Referring Provider: Lois Huxley Treating Provider/Extender: STONE III, Truxton Stupka Weeks in  Treatment: 7 Diagnosis Coding ICD-10 Codes Code Description L89.154 Pressure ulcer of sacral region, stage 4 Z99.3 Dependence on wheelchair R54 Age-related physical debility M46.28 Osteomyelitis of vertebra, sacral and sacrococcygeal region E43 Unspecified severe protein-calorie malnutrition Facility Procedures CPT4 Code: [id number] Description: 99213 - WOUND CARE VISIT-LEV 3 EST PT Modifier: Quantity: 1 Physician Procedures CPT4 Code: 4665993 Description: 99213 - WC PHYS LEVEL 3 - EST PT ICD-10 Diagnosis Description L89.154 Pressure ulcer of sacral region, stage 4 R54 Age-related physical debility Z99.3 Dependence on wheelchair M46.28 Osteomyelitis of vertebra, sacral and sacrococcygeal reg Modifier: ion Quantity: 1 Electronic  Signature(s) Signed: 11/09/2017 10:27:37 AM By: Lenda Kelp PA-C Entered By: Lenda Kelp on 11/09/2017 10:13:07

## 2017-11-19 ENCOUNTER — Encounter: Payer: Medicare Other | Admitting: Physician Assistant

## 2017-11-22 ENCOUNTER — Emergency Department: Payer: Medicare Other

## 2017-11-22 ENCOUNTER — Encounter: Payer: Self-pay | Admitting: Emergency Medicine

## 2017-11-22 ENCOUNTER — Inpatient Hospital Stay
Admission: EM | Admit: 2017-11-22 | Discharge: 2017-11-27 | DRG: 853 | Disposition: A | Discharge status: Skilled Nursing Facility | Payer: Medicare Other | Attending: Internal Medicine | Admitting: Internal Medicine

## 2017-11-22 ENCOUNTER — Inpatient Hospital Stay: Payer: Medicare Other

## 2017-11-22 ENCOUNTER — Ambulatory Visit: Payer: Medicare Other | Admitting: Physician Assistant

## 2017-11-22 ENCOUNTER — Other Ambulatory Visit: Payer: Self-pay

## 2017-11-22 DIAGNOSIS — Z792 Long term (current) use of antibiotics: Secondary | ICD-10-CM

## 2017-11-22 DIAGNOSIS — E1169 Type 2 diabetes mellitus with other specified complication: Secondary | ICD-10-CM | POA: Diagnosis present

## 2017-11-22 DIAGNOSIS — L8990 Pressure ulcer of unspecified site, unspecified stage: Secondary | ICD-10-CM

## 2017-11-22 DIAGNOSIS — M069 Rheumatoid arthritis, unspecified: Secondary | ICD-10-CM | POA: Diagnosis present

## 2017-11-22 DIAGNOSIS — B9620 Unspecified Escherichia coli [E. coli] as the cause of diseases classified elsewhere: Secondary | ICD-10-CM | POA: Diagnosis present

## 2017-11-22 DIAGNOSIS — E785 Hyperlipidemia, unspecified: Secondary | ICD-10-CM | POA: Diagnosis present

## 2017-11-22 DIAGNOSIS — Z8614 Personal history of Methicillin resistant Staphylococcus aureus infection: Secondary | ICD-10-CM | POA: Diagnosis not present

## 2017-11-22 DIAGNOSIS — E878 Other disorders of electrolyte and fluid balance, not elsewhere classified: Secondary | ICD-10-CM | POA: Diagnosis present

## 2017-11-22 DIAGNOSIS — E440 Moderate protein-calorie malnutrition: Secondary | ICD-10-CM | POA: Diagnosis present

## 2017-11-22 DIAGNOSIS — Z7401 Bed confinement status: Secondary | ICD-10-CM

## 2017-11-22 DIAGNOSIS — M8660 Other chronic osteomyelitis, unspecified site: Secondary | ICD-10-CM | POA: Diagnosis present

## 2017-11-22 DIAGNOSIS — N390 Urinary tract infection, site not specified: Secondary | ICD-10-CM | POA: Diagnosis present

## 2017-11-22 DIAGNOSIS — I959 Hypotension, unspecified: Secondary | ICD-10-CM | POA: Diagnosis present

## 2017-11-22 DIAGNOSIS — Z1612 Extended spectrum beta lactamase (ESBL) resistance: Secondary | ICD-10-CM | POA: Diagnosis present

## 2017-11-22 DIAGNOSIS — I248 Other forms of acute ischemic heart disease: Secondary | ICD-10-CM | POA: Diagnosis present

## 2017-11-22 DIAGNOSIS — R9431 Abnormal electrocardiogram [ECG] [EKG]: Secondary | ICD-10-CM

## 2017-11-22 DIAGNOSIS — A419 Sepsis, unspecified organism: Principal | ICD-10-CM | POA: Diagnosis present

## 2017-11-22 DIAGNOSIS — L89150 Pressure ulcer of sacral region, unstageable: Secondary | ICD-10-CM

## 2017-11-22 DIAGNOSIS — K219 Gastro-esophageal reflux disease without esophagitis: Secondary | ICD-10-CM | POA: Diagnosis present

## 2017-11-22 DIAGNOSIS — Z85048 Personal history of other malignant neoplasm of rectum, rectosigmoid junction, and anus: Secondary | ICD-10-CM

## 2017-11-22 DIAGNOSIS — Z515 Encounter for palliative care: Secondary | ICD-10-CM | POA: Diagnosis not present

## 2017-11-22 DIAGNOSIS — R532 Functional quadriplegia: Secondary | ICD-10-CM | POA: Diagnosis present

## 2017-11-22 DIAGNOSIS — I10 Essential (primary) hypertension: Secondary | ICD-10-CM | POA: Diagnosis present

## 2017-11-22 DIAGNOSIS — E871 Hypo-osmolality and hyponatremia: Secondary | ICD-10-CM | POA: Diagnosis present

## 2017-11-22 DIAGNOSIS — Z86718 Personal history of other venous thrombosis and embolism: Secondary | ICD-10-CM | POA: Diagnosis not present

## 2017-11-22 DIAGNOSIS — Z8249 Family history of ischemic heart disease and other diseases of the circulatory system: Secondary | ICD-10-CM

## 2017-11-22 DIAGNOSIS — L089 Local infection of the skin and subcutaneous tissue, unspecified: Secondary | ICD-10-CM | POA: Diagnosis present

## 2017-11-22 DIAGNOSIS — Z79899 Other long term (current) drug therapy: Secondary | ICD-10-CM

## 2017-11-22 DIAGNOSIS — Z6827 Body mass index (BMI) 27.0-27.9, adult: Secondary | ICD-10-CM

## 2017-11-22 DIAGNOSIS — M1990 Unspecified osteoarthritis, unspecified site: Secondary | ICD-10-CM | POA: Diagnosis present

## 2017-11-22 DIAGNOSIS — L89154 Pressure ulcer of sacral region, stage 4: Secondary | ICD-10-CM

## 2017-11-22 DIAGNOSIS — Z7189 Other specified counseling: Secondary | ICD-10-CM | POA: Diagnosis not present

## 2017-11-22 DIAGNOSIS — E876 Hypokalemia: Secondary | ICD-10-CM | POA: Diagnosis present

## 2017-11-22 DIAGNOSIS — R079 Chest pain, unspecified: Secondary | ICD-10-CM

## 2017-11-22 DIAGNOSIS — Z933 Colostomy status: Secondary | ICD-10-CM

## 2017-11-22 HISTORY — DX: Type 2 diabetes mellitus without complications: E11.9

## 2017-11-22 HISTORY — DX: Anemia, unspecified: D64.9

## 2017-11-22 LAB — URINALYSIS, COMPLETE (UACMP) WITH MICROSCOPIC
BILIRUBIN URINE: NEGATIVE
Glucose, UA: NEGATIVE mg/dL
KETONES UR: NEGATIVE mg/dL
Nitrite: NEGATIVE
PROTEIN: 30 mg/dL — AB
SQUAMOUS EPITHELIAL / LPF: NONE SEEN (ref 0–5)
Specific Gravity, Urine: 1.012 (ref 1.005–1.030)
pH: 5 (ref 5.0–8.0)

## 2017-11-22 LAB — CBC
HCT: 31.1 % — ABNORMAL LOW (ref 35.0–47.0)
HCT: 31.5 % — ABNORMAL LOW (ref 35.0–47.0)
Hemoglobin: 10.4 g/dL — ABNORMAL LOW (ref 12.0–16.0)
Hemoglobin: 10.8 g/dL — ABNORMAL LOW (ref 12.0–16.0)
MCH: 29 pg (ref 26.0–34.0)
MCH: 29.3 pg (ref 26.0–34.0)
MCHC: 33.5 g/dL (ref 32.0–36.0)
MCHC: 34.1 g/dL (ref 32.0–36.0)
MCV: 86.5 fL (ref 80.0–100.0)
MCV: 85.9 fL (ref 80.0–100.0)
PLATELETS: 186 10*3/uL (ref 150–440)
PLATELETS: 199 10*3/uL (ref 150–440)
RBC: 3.6 MIL/uL — ABNORMAL LOW (ref 3.80–5.20)
RBC: 3.67 MIL/uL — ABNORMAL LOW (ref 3.80–5.20)
RDW: 18.4 % — ABNORMAL HIGH (ref 11.5–14.5)
RDW: 18.4 % — ABNORMAL HIGH (ref 11.5–14.5)
WBC: 17.6 10*3/uL — AB (ref 3.6–11.0)
WBC: 16.3 10*3/uL — ABNORMAL HIGH (ref 3.6–11.0)

## 2017-11-22 LAB — LIPASE, BLOOD: Lipase: 55 U/L — ABNORMAL HIGH (ref 11–51)

## 2017-11-22 LAB — COMPREHENSIVE METABOLIC PANEL
ALBUMIN: 1.9 g/dL — AB (ref 3.5–5.0)
ALT: 15 U/L (ref 14–54)
ANION GAP: 12 (ref 5–15)
AST: 44 U/L — AB (ref 15–41)
Alkaline Phosphatase: 85 U/L (ref 38–126)
BILIRUBIN TOTAL: 0.4 mg/dL (ref 0.3–1.2)
BUN: 27 mg/dL — ABNORMAL HIGH (ref 6–20)
CHLORIDE: 93 mmol/L — AB (ref 101–111)
CO2: 27 mmol/L (ref 22–32)
Calcium: 4.7 mg/dL — CL (ref 8.9–10.3)
Creatinine, Ser: 2.02 mg/dL — ABNORMAL HIGH (ref 0.44–1.00)
GFR calc Af Amer: 25 mL/min — ABNORMAL LOW (ref >60)
GFR calc non Af Amer: 21 mL/min — ABNORMAL LOW (ref >60)
GLUCOSE: 128 mg/dL — AB (ref 65–99)
POTASSIUM: 2 mmol/L — AB (ref 3.5–5.1)
SODIUM: 132 mmol/L — AB (ref 135–145)
TOTAL PROTEIN: 6.1 g/dL — AB (ref 6.5–8.1)

## 2017-11-22 LAB — PREALBUMIN: Prealbumin: 6.9 mg/dL — ABNORMAL LOW (ref 18–38)

## 2017-11-22 LAB — CREATININE, SERUM
Creatinine, Ser: 1.94 mg/dL — ABNORMAL HIGH (ref 0.44–1.00)
GFR calc Af Amer: 26 mL/min — ABNORMAL LOW (ref >60)
GFR calc non Af Amer: 22 mL/min — ABNORMAL LOW (ref >60)

## 2017-11-22 LAB — LACTIC ACID, PLASMA: Lactic Acid, Venous: 1.3 mmol/L (ref 0.5–1.9)

## 2017-11-22 LAB — SEDIMENTATION RATE: SED RATE: 106 mm/h — AB (ref 0–30)

## 2017-11-22 LAB — APTT: aPTT: 48 seconds — ABNORMAL HIGH (ref 24–36)

## 2017-11-22 LAB — MAGNESIUM
MAGNESIUM: 0.7 mg/dL — AB (ref 1.7–2.4)
Magnesium: 0.3 mg/dL — CL (ref 1.7–2.4)

## 2017-11-22 LAB — TSH: TSH: 3.078 u[IU]/mL (ref 0.350–4.500)

## 2017-11-22 LAB — PROCALCITONIN: PROCALCITONIN: 0.19 ng/mL

## 2017-11-22 LAB — GLUCOSE, CAPILLARY: GLUCOSE-CAPILLARY: 140 mg/dL — AB (ref 65–99)

## 2017-11-22 LAB — TROPONIN I: Troponin I: 0.11 ng/mL (ref <0.03)

## 2017-11-22 LAB — PROTIME-INR
INR: 1.42
Prothrombin Time: 17.2 seconds — ABNORMAL HIGH (ref 11.4–15.2)

## 2017-11-22 LAB — MRSA PCR SCREENING: MRSA BY PCR: NEGATIVE

## 2017-11-22 MED ORDER — POLYETHYLENE GLYCOL 3350 17 G PO PACK: 17.0000 g | PACK | Freq: Every day | ORAL | Status: DC | PRN

## 2017-11-22 MED ORDER — PRAVASTATIN SODIUM 20 MG PO TABS
40.0000 mg | ORAL_TABLET | Freq: Every day | ORAL | Status: DC
  Administered 2017-11-23 – 2017-11-26 (×4): 40 mg via ORAL
  Filled 2017-11-22 (×4): qty 2

## 2017-11-22 MED ORDER — ONDANSETRON HCL 4 MG/2ML IJ SOLN
4.0000 mg | Freq: Four times a day (QID) | INTRAMUSCULAR | Status: DC | PRN
  Administered 2017-11-23 (×2): 4 mg via INTRAVENOUS
  Filled 2017-11-22 (×2): qty 2

## 2017-11-22 MED ORDER — VITAMIN C 500 MG PO TABS
250.0000 mg | ORAL_TABLET | Freq: Every day | ORAL | Status: DC
  Administered 2017-11-23 – 2017-11-24 (×2): 250 mg via ORAL
  Filled 2017-11-22 (×2): qty 0.5

## 2017-11-22 MED ORDER — SODIUM CHLORIDE 0.9 % IV BOLUS (SEPSIS)
1000.0000 mL | Freq: Once | INTRAVENOUS | Status: AC
  Administered 2017-11-22: 1000 mL via INTRAVENOUS

## 2017-11-22 MED ORDER — PIPERACILLIN-TAZOBACTAM 3.375 G IVPB 30 MIN: 3.3750 g | Freq: Four times a day (QID) | INTRAVENOUS | Status: DC

## 2017-11-22 MED ORDER — MORPHINE SULFATE (PF) 2 MG/ML IV SOLN
2.0000 mg | Freq: Once | INTRAVENOUS | Status: AC
  Administered 2017-11-22: 2 mg via INTRAVENOUS
  Filled 2017-11-22: qty 1

## 2017-11-22 MED ORDER — VANCOMYCIN HCL IN DEXTROSE 1-5 GM/200ML-% IV SOLN
1000.0000 mg | Freq: Once | INTRAVENOUS | Status: AC
  Administered 2017-11-22: 1000 mg via INTRAVENOUS
  Filled 2017-11-22: qty 200

## 2017-11-22 MED ORDER — OXYCODONE-ACETAMINOPHEN 5-325 MG PO TABS
1.0000 | ORAL_TABLET | Freq: Four times a day (QID) | ORAL | Status: DC | PRN
  Administered 2017-11-23 – 2017-11-26 (×6): 1 via ORAL
  Filled 2017-11-22 (×6): qty 1

## 2017-11-22 MED ORDER — PIPERACILLIN-TAZOBACTAM 3.375 G IVPB 30 MIN
3.3750 g | Freq: Once | INTRAVENOUS | Status: AC
  Administered 2017-11-22: 3.375 g via INTRAVENOUS
  Filled 2017-11-22: qty 50

## 2017-11-22 MED ORDER — SACCHAROMYCES BOULARDII 250 MG PO CAPS
250.0000 mg | ORAL_CAPSULE | Freq: Every day | ORAL | Status: DC
  Administered 2017-11-23 – 2017-11-26 (×4): 250 mg via ORAL
  Filled 2017-11-22 (×5): qty 1

## 2017-11-22 MED ORDER — INSULIN ASPART 100 UNIT/ML ~~LOC~~ SOLN: 0.0000 [IU] | Freq: Every day | SUBCUTANEOUS | Status: DC

## 2017-11-22 MED ORDER — ONDANSETRON HCL 4 MG PO TABS: 4.0000 mg | ORAL_TABLET | Freq: Four times a day (QID) | ORAL | Status: DC | PRN

## 2017-11-22 MED ORDER — ALBUTEROL SULFATE (2.5 MG/3ML) 0.083% IN NEBU: 3.0000 mL | INHALATION_SOLUTION | Freq: Four times a day (QID) | RESPIRATORY_TRACT | Status: DC | PRN

## 2017-11-22 MED ORDER — B-12 1000 MCG/ML IJ KIT: 1000.0000 ug | PACK | INTRAMUSCULAR | Status: DC

## 2017-11-22 MED ORDER — PIPERACILLIN-TAZOBACTAM 3.375 G IVPB
3.3750 g | Freq: Two times a day (BID) | INTRAVENOUS | Status: DC
  Administered 2017-11-23 – 2017-11-24 (×3): 3.375 g via INTRAVENOUS
  Filled 2017-11-22 (×3): qty 50

## 2017-11-22 MED ORDER — VANCOMYCIN HCL IN DEXTROSE 750-5 MG/150ML-% IV SOLN
750.0000 mg | INTRAVENOUS | Status: DC
  Administered 2017-11-23 – 2017-11-26 (×3): 750 mg via INTRAVENOUS
  Filled 2017-11-22 (×4): qty 150

## 2017-11-22 MED ORDER — SODIUM CHLORIDE 0.9 % IV SOLN
1.0000 g | Freq: Once | INTRAVENOUS | Status: AC
  Administered 2017-11-22: 1 g via INTRAVENOUS
  Filled 2017-11-22 (×2): qty 10

## 2017-11-22 MED ORDER — ADULT MULTIVITAMIN W/MINERALS CH
1.0000 | ORAL_TABLET | Freq: Every day | ORAL | Status: DC
  Administered 2017-11-23 – 2017-11-26 (×4): 1 via ORAL
  Filled 2017-11-22 (×4): qty 1

## 2017-11-22 MED ORDER — MAGNESIUM SULFATE IN D5W 1-5 GM/100ML-% IV SOLN
1.0000 g | Freq: Once | INTRAVENOUS | Status: AC
  Administered 2017-11-22: 1 g via INTRAVENOUS
  Filled 2017-11-22 (×2): qty 100

## 2017-11-22 MED ORDER — SODIUM CHLORIDE 0.9 % IV BOLUS
500.0000 mL | Freq: Once | INTRAVENOUS | Status: AC
  Administered 2017-11-22: 500 mL via INTRAVENOUS

## 2017-11-22 MED ORDER — ACETAMINOPHEN 325 MG PO TABS
650.0000 mg | ORAL_TABLET | Freq: Four times a day (QID) | ORAL | Status: DC | PRN
  Administered 2017-11-23 – 2017-11-26 (×2): 650 mg via ORAL
  Filled 2017-11-22 (×2): qty 2

## 2017-11-22 MED ORDER — INSULIN ASPART 100 UNIT/ML ~~LOC~~ SOLN
0.0000 [IU] | Freq: Three times a day (TID) | SUBCUTANEOUS | Status: DC
  Administered 2017-11-23 – 2017-11-24 (×2): 2 [IU] via SUBCUTANEOUS
  Administered 2017-11-25: 1 [IU] via SUBCUTANEOUS
  Administered 2017-11-26: 2 [IU] via SUBCUTANEOUS
  Administered 2017-11-26: 3 [IU] via SUBCUTANEOUS
  Administered 2017-11-27: 1 [IU] via SUBCUTANEOUS
  Filled 2017-11-22 (×6): qty 1

## 2017-11-22 MED ORDER — ASPIRIN EC 325 MG PO TBEC
325.0000 mg | DELAYED_RELEASE_TABLET | Freq: Every day | ORAL | Status: DC
  Administered 2017-11-23 – 2017-11-26 (×4): 325 mg via ORAL
  Filled 2017-11-22 (×4): qty 1

## 2017-11-22 MED ORDER — ONDANSETRON HCL 4 MG/2ML IJ SOLN
4.0000 mg | Freq: Once | INTRAMUSCULAR | Status: AC
  Administered 2017-11-22: 4 mg via INTRAVENOUS
  Filled 2017-11-22: qty 2

## 2017-11-22 MED ORDER — FAMOTIDINE IN NACL 20-0.9 MG/50ML-% IV SOLN
20.0000 mg | Freq: Two times a day (BID) | INTRAVENOUS | Status: DC
  Administered 2017-11-22: 20 mg via INTRAVENOUS
  Filled 2017-11-22: qty 50

## 2017-11-22 MED ORDER — POTASSIUM CHLORIDE 10 MEQ/100ML IV SOLN
10.0000 meq | INTRAVENOUS | Status: AC
  Filled 2017-11-22 (×3): qty 100

## 2017-11-22 MED ORDER — ACETAMINOPHEN 325 MG PO TABS: 650.0000 mg | ORAL_TABLET | ORAL | Status: DC | PRN

## 2017-11-22 MED ORDER — PREGABALIN 50 MG PO CAPS
50.0000 mg | ORAL_CAPSULE | Freq: Three times a day (TID) | ORAL | Status: DC
  Administered 2017-11-22 – 2017-11-26 (×13): 50 mg via ORAL
  Filled 2017-11-22 (×13): qty 1

## 2017-11-22 MED ORDER — ZINC SULFATE 220 (50 ZN) MG PO CAPS
220.0000 mg | ORAL_CAPSULE | Freq: Every day | ORAL | Status: DC
  Administered 2017-11-22 – 2017-11-26 (×5): 220 mg via ORAL
  Filled 2017-11-22 (×6): qty 1

## 2017-11-22 MED ORDER — ACETAMINOPHEN 650 MG RE SUPP: 650.0000 mg | Freq: Four times a day (QID) | RECTAL | Status: DC | PRN

## 2017-11-22 MED ORDER — POTASSIUM CHLORIDE 20 MEQ PO PACK
60.0000 meq | PACK | Freq: Once | ORAL | Status: AC
  Administered 2017-11-22: 60 meq via ORAL
  Filled 2017-11-22: qty 3

## 2017-11-22 MED ORDER — HEPARIN SODIUM (PORCINE) 5000 UNIT/ML IJ SOLN
5000.0000 [IU] | Freq: Three times a day (TID) | INTRAMUSCULAR | Status: DC
  Administered 2017-11-22 – 2017-11-27 (×14): 5000 [IU] via SUBCUTANEOUS
  Filled 2017-11-22 (×14): qty 1

## 2017-11-22 MED ORDER — SALINE SPRAY 0.65 % NA SOLN
2.0000 | NASAL | Status: DC | PRN
  Filled 2017-11-22: qty 44

## 2017-11-22 NOTE — Consult Note (Addendum)
SURGICAL CONSULTATION NOTE (initial) - cpt: 46659  HISTORY OF PRESENT ILLNESS (HPI):  84 y.o. female presented to [facility] ED today for evaluation of nausea with non-bloody emesis and worsening drainage from chronic stage 4 sacral pressure wound. Patient reports she became deconditioned while hospitalized in [place] 5 - 6 months ago (06/2017 - 07/2017), before which she used to live at an assisted living facility and was ambulatory using a walker, but after which she became progressively weakened and immobile, spending 5 - 8 hours per day in a wheelchair. This past March, an x-ray demonstrated inferior sacral osteomyelitis, for which she was prescribed doxycycline. Her wound has been managed by debridement at wound care center x3 per week and she was evaluated 1 month ago by ID, but her wound has reportedly continued to worsen. Patient otherwise denies fever/chills, abdominal pain, CP, or SOB with +flatus and +BM.  Surgery is consulted by ED physician Dr. Jacqualine Code in this context for evaluation and management of chronic sacral pressure wound with likely underlying chronic osteomyelitis.  PAST MEDICAL HISTORY (PMH):  Past Medical History:  Diagnosis Date  . Anemia   . Diabetes mellitus without complication ([place])      PAST SURGICAL HISTORY ([place]):  Past Surgical History:  Procedure Laterality Date  . COLOSTOMY       MEDICATIONS:  Prior to Admission medications   Medication Sig Start Date End Date Taking? Authorizing Provider  acetaminophen (TYLENOL) 325 MG tablet Take 650 mg by mouth every 4 (four) hours as needed.   Yes [provider]  albuterol (PROVENTIL) (2.5 MG/3ML) 0.083% nebulizer solution Take 3 mLs by nebulization every 6 (six) hours as needed for wheezing or shortness of breath.   Yes [provider]  amLODipine (NORVASC) 10 MG tablet Take 10 mg by mouth daily.   Yes [provider]  Cyanocobalamin (B-12) 1000 MCG/ML KIT Inject 1,000 mcg as directed every 30  (thirty) days.   Yes [provider]  hydrochlorothiazide (HYDRODIURIL) 25 MG tablet Take 25 mg by mouth daily.   Yes [provider]  levofloxacin (LEVAQUIN) 500 MG tablet Take 500 mg by mouth daily. 11/20/17 11/26/17 Yes [provider]  Multiple Vitamin (MULTIVITAMIN) tablet Take 1 tablet by mouth daily.   Yes [provider]  ondansetron (ZOFRAN) 4 MG tablet Take 4 mg by mouth every 8 (eight) hours as needed for nausea or vomiting.   Yes [provider]  oxyCODONE-acetaminophen (PERCOCET/ROXICET) 5-325 MG tablet Take 1 tablet by mouth every 6 (six) hours as needed for severe pain.   Yes [provider]  pantoprazole (PROTONIX) 40 MG tablet Take 40 mg by mouth 2 (two) times daily.   Yes [provider]  pravastatin (PRAVACHOL) 40 MG tablet Take 40 mg by mouth daily.   Yes [provider]  pregabalin (LYRICA) 50 MG capsule Take 50 mg by mouth 3 (three) times daily.   Yes [provider]  saccharomyces boulardii (FLORASTOR) 250 MG capsule Take 250 mg by mouth daily. 10/29/17 11/26/17 Yes [provider]  Saline 0.65 % (Soln) SOLN Place 2 sprays into the nose every 4 (four) hours as needed (NASAL CONGESTION).   Yes [provider]  vitamin C (ASCORBIC ACID) 250 MG tablet Take 250 mg by mouth daily.   Yes [provider]  zinc sulfate 50 MG CAPS capsule Take 220 mg by mouth daily.   Yes [provider]     ALLERGIES:  No Known Allergies   SOCIAL HISTORY:  Social History   Socioeconomic History  . Marital status: Widowed    Spouse name: Not on file  . Number of children: Not on file  . Years of education: Not on file  . Highest education level: Not on file  Occupational History  . Not on file  Social Needs  . Financial resource strain: Not on file  . Food insecurity:    Worry: Not on file    Inability: Not on file  . Transportation needs:    Medical: Not on file     Non-medical: Not on file  Tobacco Use  . Smoking status: Never Smoker  . Smokeless tobacco: Never Used  Substance and Sexual Activity  . Alcohol use: Never    Frequency: Never  . Drug use: Never  . Sexual activity: Not on file  Lifestyle  . Physical activity:    Days per week: Not on file    Minutes per session: Not on file  . Stress: Not on file  Relationships  . Social connections:    Talks on phone: Not on file    Gets together: Not on file    Attends religious service: Not on file    Active member of club or organization: Not on file    Attends meetings of clubs or organizations: Not on file    Relationship status: Not on file  . Intimate partner violence:    Fear of current or ex partner: Not on file    Emotionally abused: Not on file    Physically abused: Not on file    Forced sexual activity: Not on file  Other Topics Concern  . Not on file  Social History Narrative  . Not on file    The patient currently resides (home / rehab facility / nursing home): SNF The patient normally is (ambulatory / bedbound): Bedbound   FAMILY HISTORY:  No family history on file.   REVIEW OF SYSTEMS:  Constitutional: denies weight loss, fever, chills, or sweats  Eyes: denies any other vision changes, history of eye injury  ENT: denies sore throat, hearing problems  Respiratory: denies shortness of breath, wheezing  Cardiovascular: denies chest pain, palpitations  Gastrointestinal: denies abdominal pain, N/V, or diarrhea Genitourinary: denies burning with urination or urinary frequency Musculoskeletal: denies any other joint pains or cramps  Skin: denies any other rashes or skin discolorations except as per HPI Neurological: denies any other headache, dizziness, weakness  Psychiatric: denies any other depression, anxiety   All other review of systems were negative   VITAL SIGNS:  Temp:  [98.1 F (36.7 C)] 98.1 F (36.7 C) (05/24 1651) Pulse Rate:  [69-74] 69 (05/24  1830) Resp:  [15-16] 15 (05/24 1830) BP: (97-103)/(47-70) 103/47 (05/24 1830) SpO2:  [96 %-98 %] 98 % (05/24 1830) Weight:  [150 lb (68 kg)] 150 lb (68 kg) (05/24 1900)     Height: '5\' 1"'  (154.9 cm) Weight: 150 lb (68 kg) BMI (Calculated): 28.36   INTAKE/OUTPUT:  This shift: No intake/output data recorded.  Last 2 shifts: '@IOLAST2SHIFTS' @   PHYSICAL EXAM:  Constitutional:  -- Normal body habitus  -- Awake, alert, and oriented x3 though somewhat forgetful regarding duration/timing of events, no apparent distress Eyes:  -- Pupils equally round and reactive to light  -- No scleral icterus, B/L no occular discharge Ear, nose, throat: -- Neck is FROM WNL -- No jugular venous distension  Pulmonary:  -- No wheezes or rhales -- Equal breath sounds bilaterally -- Breathing non-labored at rest  Cardiovascular:  -- S1, S2 present  -- No pericardial rubs  Gastrointestinal:  -- Abdomen soft, nontender, non-distended, no guarding or rebound tenderness -- No abdominal masses appreciated, pulsatile or otherwise  Musculoskeletal and Integumentary:  -- Wounds or skin discoloration: stage 4 previously debrided sacral pressure wound with moderate fibrinous exudate -- Extremities: B/L UE and LE FROM, hands and feet warm, no edema  Neurologic:  -- Motor function: B/L lower extremities symmetric, weakened -- Sensation: Intact and symmetric Psychiatric:  -- Mood and affect WNL  Labs:  CBC Latest Ref Rng & Units 11/22/2017 09/07/2009 09/06/2009  WBC 3.6 - 11.0 K/uL 17.6(H) 10.3 10.2  Hemoglobin 12.0 - 16.0 g/dL 10.4(L) 9.1(L) 9.2(L)  Hematocrit 35.0 - 47.0 % 31.1(L) 28.5(L) 28.6(L)  Platelets 150 - 440 K/uL 186 129(L) 118(L)   CMP Latest Ref Rng & Units 11/22/2017 09/07/2009 09/06/2009  Glucose 65 - 99 mg/dL 128(H) 105(H) 106(H)  BUN 6 - 20 mg/dL 27(H) 17 27(H)  Creatinine 0.44 - 1.00 mg/dL 2.02(H) 1.02 1.21(H)  Sodium 135 - 145 mmol/L 132(L) 140 140  Potassium 3.5 - 5.1 mmol/L 2.0(LL) 3.7 3.7   Chloride 101 - 111 mmol/L 93(L) 109 108  CO2 22 - 32 mmol/L '27 25 27  ' Calcium 8.9 - 10.3 mg/dL 4.7(LL) 8.4 7.9(L)  Total Protein 6.5 - 8.1 g/dL 6.1(L) - -  Total Bilirubin 0.3 - 1.2 mg/dL 0.4 - -  Alkaline Phos 38 - 126 U/L 85 - -  AST 15 - 41 U/L 44(H) - -  ALT 14 - 54 U/L 15 - -   Imaging studies:  Chest X-ray (11/22/2017) No active disease.  Stable cardiomegaly and aortic atherosclerosis.  Abdominal X-ray (11/22/2017) Non-obstructive bowel gas pattern  Assessment/Plan: (ICD-10's: L89.154) 84 y.o. female with nausea and non-bloody with leukocytosis attributed to sepsis associated with acute UTI > chronic sacral pressure wound and complicated by pertinent comorbidities including advanced chronological age, DM, HTN, HLD, CAD with mitral valve insufficiency and aortic valve stenosis with cardiomegaly, rheumatoid arthritis, chronic colostomy since 1996 s/p surgical resection for rectal cancer, and GERD.   - repeat and trend am WBC  - antibiotics as per primary medical team  - pressure offloading, frequent repositioning  - will plan for bedside debridement tomorrow  - local wound care as per wound care team  - medical management of comorbidities  - DVT prophylaxis  All of the above findings and recommendations were discussed with the patient and admitting medical physician Dr. Jerelyn Charles, and all of patient's questions were answered to her expressed satisfaction.  Thank you for the opportunity to participate in this patient's care.   -- Marilynne Drivers Rosana Hoes, MD, [place]: [facility] - Partnering for exceptional care. Office: [phone]

## 2017-11-22 NOTE — ED Notes (Signed)
Calcium 4.7 and K+ 2, provider will be made aware

## 2017-11-22 NOTE — Progress Notes (Signed)
JADDA, HUNSUCKER ([id number]) Visit Report for 11/19/2017 Chief Complaint Document Details Patient Name: [name], [name]. Date of Service: 11/19/2017 1:45 PM Medical Record Number: [id number] Patient Account Number: [id number] Date of Birth/Sex: February 06, 1929 (84 y.o. F) Treating RN: Huel Coventry Primary Care Provider: Lois Huxley Other Clinician: Referring Provider: Lois Huxley Treating Provider/Extender: Linwood Dibbles, HOYT Weeks in Treatment: 8 Information Obtained from: Patient Chief Complaint She is here for evaluation of a sacrococcygeal pressure ulcer Electronic Signature(s) Signed: 11/19/2017 6:53:41 PM By: Lenda Kelp PA-C Entered By: Lenda Kelp on 11/19/2017 18:43:31 Eakes, [name] ([id number]) -------------------------------------------------------------------------------- HPI Details Patient Name: [name] Date of Service: 11/19/2017 1:45 PM Medical Record Number: [id number] Patient Account Number: [id number] Date of Birth/Sex: 19-Aug-1928 (84 y.o. F) Treating RN: Huel Coventry Primary Care Provider: Lois Huxley Other Clinician: Referring Provider: Lois Huxley Treating Provider/Extender: Linwood Dibbles, HOYT Weeks in Treatment: 8 History of Present Illness HPI Description: 09/19/17-She is here for initial evaluation of [name] sacrococcygeal pressure ulcer. She states she was hospitalized for 1 month December-January in [place]; she was out of town for her brother's funeral at [name] time. While hospitalized she developed a sacral ulcer and was discharged to [facility] and [place]. She is nonambulatory, Hoyer lift dependent. She states she is repositioned while in bed but spends approximately 5-8 hours a day in [name] wheelchair. She does admit to pain with sitting. She denies being on any antibiotic therapy or any specific evaluation for her sacral ulcer since arriving to rehabilitation. She has a listed allergy to lidocaine, but states this was a  combination medication of Aspercreme with lidocaine that she had a reaction to. She tolerated debridement of nonviable tissue today, there is no overt sign of infection and I expect a swab culture would produce a polymicrobial result which may not reflect an acute infection. We will treat with Dakin's wet-to-dry daily and she will follow-up next week. 09/26/17-She is here for evaluation for stage IV sacral pressure ulcer. Mobile x-ray was performed at her facility and [name] conclusion reads findings suspicious for osteomyelitis involving [name] inferior sacrum; we will initiate her on doxycycline and refer her to infectious disease for their input. She had a pre-albumin drawn, level 7; referral for facility dietitian to re-evaluate. There is improvement in amount of non-viable tissue, granulation tissue present; will continue with same treatment plan and follow up next week. 10/04/17 on evaluation today patient presents for follow-up concerning her sacral ulcer. She continues to experience discomfort although we did actually attempt been [name] cane spray today after discussing this with patient and her son and she seems to have tolerated this very well without any complication. There was no evidence erythema, [name] pain was improved, and in general I was pleased with this fact. [name] patient continues to be on doxycycline at this point she is going to be seeing that to [facility] we're not exactly sure when that appointment is we will check on that today. Otherwise [name] patient seems to be tolerating [name] dressing changes fairly well her son does state she had a difficult ride here today she does have a new Roho cushion he's unsure if this somehow has affected things and made it worse. 10/18/17 on evaluation today patient appears to be doing rather well in regard to her sacral wound. She has been tolerating [name] dressing changes without complication. With that being said she has no evidence of infection there still  some Slough noted currently on [name] surface of [name] wound  special at [name] 12 o'clock location which did require sharp debridement today. She tolerated this without complication at this point. Overall I am happy with how things seem to be progressing. 10/25/17 on evaluation today patient sacral wound actually appears to be doing better there is a [name] bit of slough still noted in [name] base of [name] wound so I believe we will likely be able to completely debride this away today. I feel she's at [name] point where a Wound VAC would be appropriate. Fortunately she's not having any significant pain. 11/01/17 on evaluation today patient appears to be doing rather well in regard to her sacral ulcer. [name] one thing of note is that at [name] facility they told her she cannot sit due to [name] Wound VAC because their placements directly over [name] winter site. With that being said they just need to bridge this to another site and this is something that we're gonna work on getting [name] facility trained on how to properly bridge [name] VAC. Otherwise she seems to be doing well in my opinion. 11/08/17 on evaluation today patient appears to be doing well with [name] Wound VAC on evaluation. She has been tolerating [name] changes as far as [name] dressing is concerned without complication and overall now that [name] facility was shown how to properly bridge [name] [facility] we have done much better and is able to sit up at times as well. [name] wound itself looks very clean and is progressing nicely. 11/19/17 on evaluation today patient appears to be doing poorly. And she presents [name] office at this point she tells me that she is extremely nauseated. She also appears to be very lethargic compared to normal for her. She's not sure that she can even tolerate [place] getting her up into a lift to try to get her into [name] bed, roll her over, and examining [name] wound at this point. For that reason we decided not to proceed with full evaluation today based on patient  request. [name], [name] ([id number]) Electronic Signature(s) Signed: 11/19/2017 6:53:41 PM By: Lenda Kelp PA-C Entered By: Lenda Kelp on 11/19/2017 18:43:42 [name], [name] ([id number]) -------------------------------------------------------------------------------- Physical Exam Details Patient Name: RAYNE, COWDREY Date of Service: 11/19/2017 1:45 PM Medical Record Number: [id number] Patient Account Number: [id number] Date of Birth/Sex: 1928/12/17 (84 y.o. F) Treating RN: Huel Coventry Primary Care Provider: Lois Huxley Other Clinician: Referring Provider: Lois Huxley Treating Provider/Extender: STONE III, HOYT Weeks in Treatment: 8 Constitutional Thin and well-hydrated in no acute distress. Psychiatric this patient is able to make decisions and demonstrates good insight into disease process. Alert and Oriented x 3. pleasant and cooperative. Notes On physical exam patient appears to be very dry in regard to [name] oral mucosa at this point. In general she seems to be very weak compared to her normal and I think this is due to [name] fact that she has been sick and still is. She tells me specifically she's not sure she can tolerate [place] lifting her into [name] sling in order to get her into [name] bed and then rolling her over. She feels like she may have more vomiting if we do this. Electronic Signature(s) Signed: 11/19/2017 6:53:41 PM By: Lenda Kelp PA-C Entered By: Lenda Kelp on 11/19/2017 18:44:53 [place], [name] ([id number]) -------------------------------------------------------------------------------- Problem List Details Patient Name: [name], [name] Date of Service: 11/19/2017 1:45 PM Medical Record Number: [id number] Patient Account Number: [id number] Date of Birth/Sex: Jan 09, 1929 (84 y.o. F) Treating RN: Huel Coventry Primary Care  Provider: Lois Huxley Other Clinician: Referring Provider: Lois Huxley Treating Provider/Extender: Linwood Dibbles, HOYT Weeks in Treatment:  8 Active Problems ICD-10 Impacting Encounter Code Description Active Date Wound Healing Diagnosis L89.154 Pressure ulcer of sacral region, stage 4 09/19/2017 Yes Z99.3 Dependence on wheelchair 09/19/2017 Yes R54 Age-related physical debility 09/19/2017 Yes M46.28 Osteomyelitis of vertebra, sacral and sacrococcygeal region 09/26/2017 Yes E43 Unspecified severe protein-calorie malnutrition 09/26/2017 Yes Inactive Problems Resolved Problems Electronic Signature(s) Signed: 11/19/2017 6:53:41 PM By: Lenda Kelp PA-C Entered By: Lenda Kelp on 11/19/2017 18:43:24 [name], [name] ([id number]) -------------------------------------------------------------------------------- Progress Note Details Patient Name: [name]. Date of Service: 11/19/2017 1:45 PM Medical Record Number: [id number] Patient Account Number: [id number] Date of Birth/Sex: Jul 27, 1928 (84 y.o. F) Treating RN: Huel Coventry Primary Care Provider: Lois Huxley Other Clinician: Referring Provider: Lois Huxley Treating Provider/Extender: Linwood Dibbles, HOYT Weeks in Treatment: 8 Subjective Chief Complaint Information obtained from Patient She is here for evaluation of a sacrococcygeal pressure ulcer History of Present Illness (HPI) 09/19/17-She is here for initial evaluation of [name] sacrococcygeal pressure ulcer. She states she was hospitalized for 1 month December-January in [place]; she was out of town for her brother's funeral at [name] time. While hospitalized she developed a sacral ulcer and was discharged to [facility] and [place]. She is nonambulatory, Hoyer lift dependent. She states she is repositioned while in bed but spends approximately 5-8 hours a day in [name] wheelchair. She does admit to pain with sitting. She denies being on any antibiotic therapy or any specific evaluation for her sacral ulcer since arriving to rehabilitation. She has a listed allergy to lidocaine, but states this was a  combination medication of Aspercreme with lidocaine that she had a reaction to. She tolerated debridement of nonviable tissue today, there is no overt sign of infection and I expect a swab culture would produce a polymicrobial result which may not reflect an acute infection. We will treat with Dakin's wet-to-dry daily and she will follow-up next week. 09/26/17-She is here for evaluation for stage IV sacral pressure ulcer. Mobile x-ray was performed at her facility and [name] conclusion reads findings suspicious for osteomyelitis involving [name] inferior sacrum; we will initiate her on doxycycline and refer her to infectious disease for their input. She had a pre-albumin drawn, level 7; referral for facility dietitian to re-evaluate. There is improvement in amount of non-viable tissue, granulation tissue present; will continue with same treatment plan and follow up next week. 10/04/17 on evaluation today patient presents for follow-up concerning her sacral ulcer. She continues to experience discomfort although we did actually attempt been [name] cane spray today after discussing this with patient and her son and she seems to have tolerated this very well without any complication. There was no evidence erythema, [name] pain was improved, and in general I was pleased with this fact. [name] patient continues to be on doxycycline at this point she is going to be seeing that to [facility] we're not exactly sure when that appointment is we will check on that today. Otherwise [name] patient seems to be tolerating [name] dressing changes fairly well her son does state she had a difficult ride here today she does have a new Roho cushion he's unsure if this somehow has affected things and made it worse. 10/18/17 on evaluation today patient appears to be doing rather well in regard to her sacral wound. She has been tolerating [name] dressing changes without complication. With that being said she has no evidence of infection  there still  some Slough noted currently on [name] surface of [name] wound special at [name] 12 o'clock location which did require sharp debridement today. She tolerated this without complication at this point. Overall I am happy with how things seem to be progressing. 10/25/17 on evaluation today patient sacral wound actually appears to be doing better there is a [name] bit of slough still noted in [name] base of [name] wound so I believe we will likely be able to completely debride this away today. I feel she's at [name] point where a Wound VAC would be appropriate. Fortunately she's not having any significant pain. 11/01/17 on evaluation today patient appears to be doing rather well in regard to her sacral ulcer. [name] one thing of note is that at [name] facility they told her she cannot sit due to [name] Wound VAC because their placements directly over [name] winter site. With that being said they just need to bridge this to another site and this is something that we're gonna work on getting [name] facility trained on how to properly bridge [name] VAC. Otherwise she seems to be doing well in my opinion. 11/08/17 on evaluation today patient appears to be doing well with [name] Wound VAC on evaluation. She has been tolerating [name] changes as far as [name] dressing is concerned without complication and overall now that [name] facility was shown how to properly bridge [name] [facility] we have done much better and is able to sit up at times as well. [name] wound itself looks very clean and is progressing nicely. [name], [name] ([id number]) 11/19/17 on evaluation today patient appears to be doing poorly. And she presents [name] office at this point she tells me that she is extremely nauseated. She also appears to be very lethargic compared to normal for her. She's not sure that she can even tolerate [place] getting her up into a lift to try to get her into [name] bed, roll her over, and examining [name] wound at this point. For that reason we decided not to proceed with full evaluation  today based on patient request. Patient History Information obtained from Patient. Family History Cancer - Child, No family history of Diabetes, Heart Disease, Hereditary Spherocytosis, Hypertension, Kidney Disease, Lung Disease, Seizures, Stroke, Thyroid Problems, Tuberculosis. Social History Never smoker, Marital Status - Widowed, Alcohol Use - Never, Drug Use - No History, Caffeine Use - Rarely. Medical And Surgical History Notes Cardiovascular hyperlipidemia Gastrointestinal hernia repair December 2018, colostomy Oncologic rectal cancer 20 years ago Review of Systems (ROS) Constitutional Symptoms (General Health) Denies complaints or symptoms of Fever, Chills. Respiratory [name] patient has no complaints or symptoms. Cardiovascular [name] patient has no complaints or symptoms. Neurologic [name] patient has no complaints or symptoms. Objective Constitutional Thin and well-hydrated in no acute distress. Psychiatric this patient is able to make decisions and demonstrates good insight into disease process. Alert and Oriented x 3. pleasant and cooperative. General Notes: On physical exam patient appears to be very dry in regard to [name] oral mucosa at this point. In general she seems to be very weak compared to her normal and I think this is due to [name] fact that she has been sick and still is. She tells me specifically she's not sure she can tolerate [place] lifting her into [name] sling in order to get her into [name] bed and then rolling her over. She feels like she may have more vomiting if we do this. [name], [name] ([id number]) Assessment Active Problems ICD-10 L89.154 - Pressure ulcer  of sacral region, stage 4 Z99.3 - Dependence on wheelchair R54 - Age-related physical debility M46.28 - Osteomyelitis of vertebra, sacral and sacrococcygeal region E43 - Unspecified severe protein-calorie malnutrition Plan Currently I'm going to suggest that we not further evaluate this patient's wound  today based on her request considering [name] fact that she is not doing very well. We will see her for reevaluation in one weeks time and hopefully she will be feeling much better. She is in agreement with this plan. Electronic Signature(s) Signed: 11/19/2017 6:53:41 PM By: Lenda Kelp PA-C Entered By: Lenda Kelp on 11/19/2017 18:45:17 [place], [name] ([id number]) -------------------------------------------------------------------------------- ROS/PFSH Details Patient Name: [name], [name] Date of Service: 11/19/2017 1:45 PM Medical Record Number: [id number] Patient Account Number: [id number] Date of Birth/Sex: August 09, 1928 (84 y.o. F) Treating RN: Huel Coventry Primary Care Provider: Lois Huxley Other Clinician: Referring Provider: Lois Huxley Treating Provider/Extender: Linwood Dibbles, HOYT Weeks in Treatment: 8 Information Obtained From Patient Wound History Do you currently have one or more open woundso Yes How many open wounds do you currently haveo 1 Approximately how long have you had your woundso since December How have you been treating your wound(s) until nowo dakins Has your wound(s) ever healed and then re-openedo No Have you had any lab work done in [name] past montho No Have you tested positive for an antibiotic resistant organism (MRSA, VRE)o No Have you tested positive for osteomyelitis (bone infection)o No Have you had any tests for circulation on your legso No Constitutional Symptoms (General Health) Complaints and Symptoms: Negative for: Fever; Chills Eyes Medical History: Positive for: Cataracts - removed Hematologic/Lymphatic Medical History: Positive for: Anemia Respiratory Complaints and Symptoms: No Complaints or Symptoms Medical History: Negative for: Aspiration; Asthma; Chronic Obstructive Pulmonary Disease (COPD); Pneumothorax; Sleep Apnea; Tuberculosis Cardiovascular Complaints and Symptoms: No Complaints or Symptoms Medical History: Positive for:  Hypertension Negative for: Angina; Arrhythmia; Congestive Heart Failure; Coronary Artery Disease; Deep Vein Thrombosis; Hypotension; Myocardial Infarction; Peripheral Arterial Disease; Peripheral Venous Disease; Phlebitis; Vasculitis Past Medical History Notes: hyperlipidemia [name], [name] ([id number]) Gastrointestinal Medical History: Negative for: Cirrhosis ; Colitis; Crohnos; Hepatitis A; Hepatitis B; Hepatitis C Past Medical History Notes: hernia repair December 2018, colostomy Endocrine Medical History: Positive for: Type II Diabetes Immunological Medical History: Negative for: Lupus Erythematosus; Raynaudos Integumentary (Skin) Medical History: Negative for: History of Burn; History of pressure wounds Musculoskeletal Medical History: Positive for: Rheumatoid Arthritis; Osteoarthritis Neurologic Complaints and Symptoms: No Complaints or Symptoms Medical History: Negative for: Dementia; Neuropathy Oncologic Medical History: Positive for: Received Chemotherapy; Received Radiation Past Medical History Notes: rectal cancer 20 years ago HBO Extended History Items Eyes: Cataracts Immunizations Pneumococcal Vaccine: Received Pneumococcal Vaccination: Yes Immunization Notes: up to date Implantable Devices Family and Social History Cancer: Yes - Child; Diabetes: No; Heart Disease: No; Hereditary Spherocytosis: No; Hypertension: No; Kidney Disease: No; Lung Disease: No; Seizures: No; Stroke: No; Thyroid Problems: No; Tuberculosis: No; Never smoker; Marital Status - Widowed; Alcohol Use: Never; Drug Use: No History; Caffeine Use: Rarely; Financial Concerns: No; Food, Clothing or [name], [name] ([id number]) Shelter Needs: No; Support System Lacking: No; Transportation Concerns: No; Advanced Directives: No; Patient does not want information on Advanced Directives Physician Affirmation I have reviewed and agree with [name] above information. Electronic Signature(s) Signed:  11/19/2017 6:53:41 PM By: Lenda Kelp PA-C Signed: 11/20/2017 5:19:26 PM By: Elliot Gurney, BSN, RN, CWS, Kim RN, BSN Entered By: Lenda Kelp on 11/19/2017 18:44:10 [name], [name] ([id number]) -------------------------------------------------------------------------------- SuperBill Details Patient Name: [name] [facility],  [facility]is[facility] J[facility] Date of Service: 11/19/2017 Medical Record Number: [id number] Patient Account Number: [id number] Date of Birth/Sex: 26-Feb-1929 (84 y.o. F) Treating RN: Huel Coventry Primary Care Provider: Lois Huxley Other Clinician: Referring Provider: Lois Huxley Treating Provider/Extender: Linwood Dibbles, HOYT Weeks in Treatment: 8 Diagnosis Coding ICD-10 Codes Code Description L89.154 Pressure ulcer of sacral region, stage 4 Z99.3 Dependence on wheelchair R54 Age-related physical debility M46.28 Osteomyelitis of vertebra, sacral and sacrococcygeal region E43 Unspecified severe protein-calorie malnutrition Physician Procedures CPT4 Code: [id number] Description: 65784 - WC PHYS LEVEL 2 - EST PT ICD-10 Diagnosis Description L89.154 Pressure ulcer of sacral region, stage 4 Z99.3 Dependence on wheelchair R54 Age-related physical debility M46.28 Osteomyelitis of vertebra, sacral and sacrococcygeal reg Modifier: ion Quantity: 1 Electronic Signature(s) Signed: 11/19/2017 6:53:41 PM By: Lenda Kelp PA-C Entered By: Lenda Kelp on 11/19/2017 18:45:37

## 2017-11-22 NOTE — Progress Notes (Signed)
Family Meeting Note  Advance Directive:yes  Today a meeting took place with the Patient.  Patient is able to participate   The following clinical team members were present during this meeting:MD  The following were discussed:Patient's diagnosis: Chronic sacral decubitus wound, nonambulatory state, diabetes, hypertension, GERD, history of MRSA, angioedema history, hyperlipidemia, history of colon cancer with ostomy in place, chronic deconditioning, Patient's progosis: Unable to determine and Goals for treatment: Full Code  Additional follow-up to be provided: prn  Time spent during discussion:20 minutes  Bertrum Sol, MD

## 2017-11-22 NOTE — ED Notes (Signed)
Mag 0.3 provider will notified

## 2017-11-22 NOTE — ED Triage Notes (Signed)
Pt to ED via EMS from [facility] in [place] , pt is from wound clinic for stage 4 sacral wound. Per clinic pt was vomiting today and has increased drainage and foul smell . VSS per EMS . Pt A&OX4

## 2017-11-22 NOTE — Progress Notes (Signed)
Pharmacy Antibiotic Note  [name] is a 84 y.o. female admitted on 11/22/2017 with wound infection.  Pharmacy has been consulted for Vanc and zosyn dosing. 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   Plan: Zosyn 3.375 gm IV X 1 given in ED on 5/24 @ 18:00. Zosyn 3.375 gm IV Q12H ordered to start on 5/25 @ 0600.   Vancomycin 1 gm IV X 1 given on 5/24 @ 18:00. Vancomycin 750 mg IV Q36H ordered to start on 5/25 @ 14:00, ~ 20 hrs after 1st dose (stacked dosing). Will draw 1st trough on 5/28 @ 13:30 , which will not be at C[facility].   Height: 5\' 1"  (154.9 cm) Weight: 150 lb (68 kg) IBW/kg (Calculated) : 47.8  Temp (24hrs), Avg:98.1 F (36.7 C), Min:98.1 F (36.7 C), Max:98.1 F (36.7 C)  Recent Labs  Lab 11/22/17 1658 11/22/17 1723  WBC 17.6*  --   CREATININE 2.02*  --   LATICACIDVEN  --  1.3    Estimated Creatinine Clearance: 17.6 mL/min (A) (by C-G formula based on SCr of 2.02 mg/dL (H)).    No Known Allergies  Antimicrobials this admission:   >>    >>   Dose adjustments this admission:   Microbiology results:  BCx:   UCx:    Sputum:    MRSA PCR:   Thank you for allowing pharmacy to be a part of this patient's care.  Abdikadir Fohl D 11/22/2017 7:20 PM

## 2017-11-22 NOTE — ED Provider Notes (Signed)
[facility] Emergency Department Provider Note   ____________________________________________   First MD Initiated Contact with Patient 11/22/17 1649     (approximate)  I have reviewed the triage vital signs and the nursing notes.   HISTORY  Chief Complaint Wound Infection    HPI [name] is a 84 y.o. female previous history of osteoarthritis, hypertension diabetes DVT, colorectal cancer and frequent staph infections.  Recent left knee replacement  Patient presents for evaluation for worsening wound involving her sacrum.  Been followed closely by the wound care clinic, but over the last couple of weeks seems to be worsening with new redness surrounding and some slight purulent discharge.  In addition the patient reports for about a week now she has had intermittent episodes of nausea.  She denies abdominal pain, reports pain around the buttock and sacrum, worsening with movement and rolls for at least a week.  The doctor at her facility recommended she come for further evaluation because of the wound on her buttock and nausea.  She denies fevers or chills.  No chest pain or trouble breathing.  No abdominal pain.  No black or bloody output from her ostomy.  She has not seen the sacral wound herself but has been told by the nurses he keeps looking worse and worse.   Past Medical History:  Diagnosis Date  . Anemia   . Diabetes mellitus without complication [facility])     Patient Active Problem List   Diagnosis Date Noted  . JOINT EFFUSION, LEFT KNEE 04/19/2008  . METHICILLIN RESISTANT STAPHYLOCOCCUS AUREUS INFECTION 02/19/2008  . PROSTHETIC JOINT COMPLICATION 02/19/2008  . SPLENECTOMY, HX OF 02/19/2008    Past Surgical History:  Procedure Laterality Date  . COLOSTOMY      Prior to Admission medications   Not on File    Allergies Patient has no known allergies.  No family history on file.  Social History Social History   Tobacco Use  .  Smoking status: Never Smoker  . Smokeless tobacco: Never Used  Substance Use Topics  . Alcohol use: Never    Frequency: Never  . Drug use: Never    Review of Systems Constitutional: No fever/chills Eyes: No visual changes. ENT: No sore throat. Cardiovascular: Denies chest pain. Respiratory: Denies shortness of breath. Gastrointestinal: No abdominal pain.  No diarrhea.  No constipation. Genitourinary: Negative for dysuria. Musculoskeletal: Negative for back pain except around sacral wound. Skin: Negative for rash. Neurological: Negative for headaches, focal weakness or numbness.    ____________________________________________   PHYSICAL EXAM:  VITAL SIGNS: ED Triage Vitals  Enc Vitals Group     BP --      Pulse Rate 11/22/17 1645 74     Resp 11/22/17 1645 16     Temp 11/22/17 1651 98.1 F (36.7 C)     Temp Source 11/22/17 1651 Oral     SpO2 11/22/17 1645 96 %     Weight --      Height --      Head Circumference --      Peak Flow --      Pain Score --      Pain Loc --      Pain Edu? --      Excl. in GC? --     Constitutional: Alert and oriented.  Chronically ill-appearing but in no extremis or distress. Eyes: Conjunctivae are normal. Head: Atraumatic. Nose: No congestion/rhinnorhea. Mouth/Throat: Mucous membranes are moderately dry. Neck: No stridor.   Cardiovascular:  Normal rate, regular rhythm. Grossly normal heart sounds.  Good peripheral circulation. Respiratory: Normal respiratory effort.  No retractions. Lungs CTAB. Gastrointestinal: Soft and nontender. No distention. Patient logrolled, her back demonstrates a very deep approximately softball sized decubitus ulcer that appears unstageable.  It is notably deep and has erythema surrounding it about 1 to 2 cm circumferentially, with some slight purulence.  There was notable packing with gauze material denoted in the wound. Musculoskeletal: No lower extremity tenderness nor edema. Neurologic:  Normal speech  and language. No gross focal neurologic deficits are appreciated though she is fairly weak in both lower extremities with about 4-5 strength bilateral.  Skin:  Skin is warm, dry and intact. No rash noted. Psychiatric: Mood and affect are normal. Speech and behavior are normal.  ____________________________________________   LABS (all labs ordered are listed, but only abnormal results are displayed)  Labs Reviewed  CBC - Abnormal; Notable for the following components:      Result Value   WBC 17.6 (*)    RBC 3.60 (*)    Hemoglobin 10.4 (*)    HCT 31.1 (*)    RDW 18.4 (*)    All other components within normal limits  COMPREHENSIVE METABOLIC PANEL - Abnormal; Notable for the following components:   Sodium 132 (*)    Potassium 2.0 (*)    Chloride 93 (*)    Glucose, Bld 128 (*)    BUN 27 (*)    Creatinine, Ser 2.02 (*)    Calcium 4.7 (*)    Total Protein 6.1 (*)    Albumin 1.9 (*)    AST 44 (*)    GFR calc non Af Amer 21 (*)    GFR calc Af Amer 25 (*)    All other components within normal limits  LIPASE, BLOOD - Abnormal; Notable for the following components:   Lipase 55 (*)    All other components within normal limits  CULTURE, BLOOD (ROUTINE X 2)  CULTURE, BLOOD (ROUTINE X 2)  URINE CULTURE  LACTIC ACID, PLASMA  LACTIC ACID, PLASMA  MAGNESIUM  URINALYSIS, COMPLETE (UACMP) WITH MICROSCOPIC   ____________________________________________  EKG  EKG reviewed and entered by me at 1732 Heart rate 65 9 cures 179 QTC 600!!  EKG was initially to have but due to patient artifact essentially uninterpretable.  Will repeat again later ____________________________________________  RADIOLOGY    ____________________________________________   PROCEDURES  Procedure(s) performed: None  Procedures  Critical Care performed: Yes, see critical care note(s)  CRITICAL CARE Performed by: Sharyn Creamer   Total critical care time: 40 minutes  Critical care time was exclusive of  separately billable procedures and treating other patients.  Critical care was necessary to treat or prevent imminent or life-threatening deterioration.  Critical care was time spent personally by me on the following activities: development of treatment plan with patient and/or surrogate as well as nursing, discussions with consultants, evaluation of patient's response to treatment, examination of patient, obtaining history from patient or surrogate, ordering and performing treatments and interventions, ordering and review of laboratory studies, ordering and review of radiographic studies, pulse oximetry and re-evaluation of patient's condition.  Patient with elevated concern for sepsis.  Notably elevated white count, mild hypotension, refill requiring fluid boluses, multiple antibiotics, consult with general surgery for wound care admission to the hospital.  Also critically low calcium and potassium which are being repleted peripherally. ____________________________________________   INITIAL IMPRESSION / ASSESSMENT AND PLAN / ED COURSE  Pertinent labs & imaging results that  were available during my care of the patient were reviewed by me and considered in my medical decision making (see chart for details).  Consult is been placed with Dr. Satira Mccallum of general surgery will see the patient in consult but recommends patient be admitted to the medical service for ongoing care including out of potential sepsis and notable electrolyte abnormalities.  Patient alert, oriented, chronically ill-appearing, but now with elevated concern for infection primarily involving the sacral decubitus wound.  Consult by general surgery, discussed with Dr. [name] the patient's previous abdominal surgery, sacral wound, concerns for nausea as well and that her abdominal radiograph are pending at the time of admission.  Will replete electrolytes, QT notably prolonged, will discontinue use of Zofran and give IV  magnesium.  Patient condition very guarded at this time.   ----------------------------------------- 6:05 PM on 11/22/2017 -----------------------------------------  Patient reassessment.  Alert and oriented.  Agreeable and understanding with plan for admission and surgery consultation.  Patient reassessment, strong normal capillary refill alert, clear lungs and no distress.      ____________________________________________   FINAL CLINICAL IMPRESSION(S) / ED DIAGNOSES  Final diagnoses:  Sepsis, due to unspecified organism (HCC)  Pressure injury of sacral region, unstageable (HCC)  Prolonged Q-T interval on ECG  Hypokalemia  Hypomagnesemia      NEW MEDICATIONS STARTED DURING THIS VISIT:  New Prescriptions   No medications on file     Note:  This document was prepared using Dragon voice recognition software and may include unintentional dictation errors.     Sharyn Creamer, MD 11/24/17 [phone]

## 2017-11-22 NOTE — H&P (Signed)
[facility] - [place] [facility]   PATIENT NAME: [name]    MR#:  [id number]  DATE OF BIRTH:  1929/02/05  DATE OF ADMISSION:  11/22/2017  PRIMARY CARE PHYSICIAN: Carmin Richmond, MD   REQUESTING/REFERRING PHYSICIAN:   CHIEF COMPLAINT:   Chief Complaint  Patient presents with  . Wound Infection    HISTORY OF PRESENT ILLNESS: Tara Rud  is a 84 y.o. female with a known history per below, chronic stage IV sacral decubitus wound, sent from NH w/ acute emesis with nausea, increasing wound size with drainage/foul-smelling of her sacral wound, ER work-up noted for sodium 132, chloride 93, potassium 2, white count 17,000, noted hypotension, seen in the emergency room, no apparent distress, resting comfortably in bed, patient now being admitted for acute probable sepsis secondary to infected acute on chronic stage IV sacral wound, acute severe hypokalemia.  PAST MEDICAL HISTORY:   Past Medical History:  Diagnosis Date  . Anemia   . Diabetes mellitus without complication (HCC)     PAST SURGICAL HISTORY:  Past Surgical History:  Procedure Laterality Date  . COLOSTOMY      SOCIAL HISTORY:  Social History   Tobacco Use  . Smoking status: Never Smoker  . Smokeless tobacco: Never Used  Substance Use Topics  . Alcohol use: Never    Frequency: Never    FAMILY HISTORY:  HTN  DRUG ALLERGIES: No Known Allergies  REVIEW OF SYSTEMS:   CONSTITUTIONAL: No fever, +fatigue or weakness.  EYES: No blurred or double vision.  EARS, NOSE, AND THROAT: No tinnitus or ear pain.  RESPIRATORY: No cough, shortness of breath, wheezing or hemoptysis.  CARDIOVASCULAR: No chest pain, orthopnea, edema.  GASTROINTESTINAL: No nausea, vomiting, diarrhea or abdominal pain.  GENITOURINARY: No dysuria, hematuria.  ENDOCRINE: No polyuria, nocturia,  HEMATOLOGY: No anemia, easy bruising or bleeding SKIN: Sacral wound  MUSCULOSKELETAL: No joint pain or arthritis.   NEUROLOGIC: No  tingling, numbness, weakness.  PSYCHIATRY: No anxiety or depression.   MEDICATIONS AT HOME:  Prior to Admission medications   Not on File      PHYSICAL EXAMINATION:   VITAL SIGNS: Blood pressure 97/70, pulse 69, temperature 98.1 F (36.7 C), temperature source Oral, resp. rate 16, SpO2 96 %.  GENERAL:  84 y.o.-year-old patient lying in the bed with no acute distress.  Frail-appearing EYES: Pupils equal, round, reactive to light and accommodation. No scleral icterus. Extraocular muscles intact.  HEENT: Head atraumatic, normocephalic. Oropharynx and nasopharynx clear.  NECK:  Supple, no jugular venous distention. No thyroid enlargement, no tenderness.  LUNGS: Normal breath sounds bilaterally, no wheezing, rales,rhonchi or crepitation. No use of accessory muscles of respiration.  CARDIOVASCULAR: S1, S2 normal. No murmurs, rubs, or gallops.  ABDOMEN: Soft, nontender, nondistended. Bowel sounds present. No organomegaly or mass.  EXTREMITIES: No pedal edema, cyanosis, or clubbing. + Diffuse muscular wasting NEUROLOGIC: Cranial nerves II through XII are intact. MAES. Gait not checked.  PSYCHIATRIC: The patient is alert and oriented x 3.  SKIN: Stage IV sacral decubitus wound with purulent drainage noted  LABORATORY PANEL:   CBC Recent Labs  Lab 11/22/17 1658  WBC 17.6*  HGB 10.4*  HCT 31.1*  PLT PENDING  MCV 86.5  MCH 29.0  MCHC 33.5  RDW 18.4*   ------------------------------------------------------------------------------------------------------------------  Chemistries  Recent Labs  Lab 11/22/17 1600 11/22/17 1658  NA  --  132*  K  --  2.0*  CL  --  93*  CO2  --  27  GLUCOSE  --  128*  BUN  --  27*  CREATININE  --  2.02*  CALCIUM  --  4.7*  MG 0.3*  --   AST  --  44*  ALT  --  15  ALKPHOS  --  85  BILITOT  --  0.4   ------------------------------------------------------------------------------------------------------------------ CrCl cannot be calculated  (Unknown ideal weight.). ------------------------------------------------------------------------------------------------------------------ No results for input(s): TSH, T4TOTAL, T3FREE, THYROIDAB in the last 72 hours.  Invalid input(s): FREET3   Coagulation profile No results for input(s): INR, PROTIME in the last 168 hours. ------------------------------------------------------------------------------------------------------------------- No results for input(s): DDIMER in the last 72 hours. -------------------------------------------------------------------------------------------------------------------  Cardiac Enzymes No results for input(s): CKMB, TROPONINI, MYOGLOBIN in the last 168 hours.  Invalid input(s): CK ------------------------------------------------------------------------------------------------------------------ Invalid input(s): POCBNP  ---------------------------------------------------------------------------------------------------------------  Urinalysis    Component Value Date/Time   COLORURINE YELLOW 08/31/2009 0910   APPEARANCEUR CLEAR 08/31/2009 0910   LABSPEC 1.009 08/31/2009 0910   PHURINE 6.5 08/31/2009 0910   GLUCOSEU NEGATIVE 08/31/2009 0910   HGBUR NEGATIVE 08/31/2009 0910   BILIRUBINUR NEGATIVE 08/31/2009 0910   KETONESUR NEGATIVE 08/31/2009 0910   PROTEINUR NEGATIVE 08/31/2009 0910   UROBILINOGEN 0.2 08/31/2009 0910   NITRITE NEGATIVE 08/31/2009 0910   LEUKOCYTESUR  08/31/2009 0910    NEGATIVE MICROSCOPIC NOT DONE ON URINES WITH NEGATIVE PROTEIN, BLOOD, LEUKOCYTES, NITRITE, OR GLUCOSE <1000 mg/dL.     RADIOLOGY: No results found.  EKG: Orders placed or performed during the hospital encounter of 11/22/17  . ED EKG  . ED EKG  . EKG 12-Lead  . EKG 12-Lead  . EKG 12-Lead  . EKG 12-Lead    IMPRESSION AND PLAN: *Acute probable sepsis secondary to infected sacral decubitus wound Start sepsis protocol, empiric vancomycin/Zosyn,  follow-up on cultures  *Acute on chronic infected sacral decubitus wound, stage IV General surgery to see this patient may require debridement, empiric antibiotics per above  *Acute severe hypokalemia, hypochloremia, hyponatremia Replete with IV/p.o. potassium, check magnesium level, BMP in the morning  *Chronic diabetes mellitus type 2 Sliding scale insulin with Accu-Cheks per routine  *Chronic functional quadriplegia, chronic deconditioning, bedbound state Increase nursing care PRN, aspiration/fall precautions, check prealbumin level, dietary to see  *Chronic benign essential hypertension Hold antihypertensives given relative hypotension, vitals per routine, make changes as per necessary  *Hypochloremia, hyponatremia   Stable condition long-term prognosis is poor Full code DVT prophylaxis with heparin subcu Disposition back to nursing home in 2 to 3 days barring any complications, consider palliative care consultation   All the records are reviewed and case discussed with ED provider. Management plans discussed with the patient, family and they are in agreement.  CODE STATUS:    TOTAL TIME TAKING CARE OF THIS PATIENT: 45 minutes.    Evelena Asa Shaune Malacara M.D on 11/22/2017   Between 7am to 6pm - Pager - [phone]  After 6pm go to [email or web address] - password [email or web address]  [facility]  Office  [phone]  CC: Primary care physician; Carmin Richmond, MD   Note: This dictation was prepared with Dragon dictation along with smaller phrase technology. Any transcriptional errors that result from this process are unintentional.

## 2017-11-23 DIAGNOSIS — R748 Abnormal levels of other serum enzymes: Secondary | ICD-10-CM

## 2017-11-23 DIAGNOSIS — L8990 Pressure ulcer of unspecified site, unspecified stage: Secondary | ICD-10-CM

## 2017-11-23 LAB — GLUCOSE, CAPILLARY
GLUCOSE-CAPILLARY: 186 mg/dL — AB (ref 65–99)
GLUCOSE-CAPILLARY: 166 mg/dL — AB (ref 65–99)
Glucose-Capillary: 114 mg/dL — ABNORMAL HIGH (ref 65–99)
Glucose-Capillary: 111 mg/dL — ABNORMAL HIGH (ref 65–99)

## 2017-11-23 LAB — MAGNESIUM
MAGNESIUM: 0.9 mg/dL — AB (ref 1.7–2.4)
MAGNESIUM: 3.5 mg/dL — AB (ref 1.7–2.4)

## 2017-11-23 LAB — BASIC METABOLIC PANEL
ANION GAP: 11 (ref 5–15)
Anion gap: 11 (ref 5–15)
Anion gap: 11 (ref 5–15)
BUN: 23 mg/dL — ABNORMAL HIGH (ref 6–20)
BUN: 21 mg/dL — AB (ref 6–20)
BUN: 22 mg/dL — AB (ref 6–20)
CALCIUM: 5.3 mg/dL — AB (ref 8.9–10.3)
CO2: 24 mmol/L (ref 22–32)
CO2: 23 mmol/L (ref 22–32)
CO2: 23 mmol/L (ref 22–32)
CREATININE: 1.61 mg/dL — AB (ref 0.44–1.00)
Calcium: 4.9 mg/dL — CL (ref 8.9–10.3)
Calcium: 4.9 mg/dL — CL (ref 8.9–10.3)
Chloride: 98 mmol/L — ABNORMAL LOW (ref 101–111)
Chloride: 97 mmol/L — ABNORMAL LOW (ref 101–111)
Chloride: 95 mmol/L — ABNORMAL LOW (ref 101–111)
Creatinine, Ser: 1.73 mg/dL — ABNORMAL HIGH (ref 0.44–1.00)
Creatinine, Ser: 1.54 mg/dL — ABNORMAL HIGH (ref 0.44–1.00)
GFR calc Af Amer: 30 mL/min — ABNORMAL LOW (ref >60)
GFR calc Af Amer: 34 mL/min — ABNORMAL LOW (ref >60)
GFR, EST AFRICAN AMERICAN: 32 mL/min — AB (ref >60)
GFR, EST NON AFRICAN AMERICAN: 26 mL/min — AB (ref >60)
GFR, EST NON AFRICAN AMERICAN: 28 mL/min — AB (ref >60)
GFR, EST NON AFRICAN AMERICAN: 29 mL/min — AB (ref >60)
GLUCOSE: 113 mg/dL — AB (ref 65–99)
GLUCOSE: 93 mg/dL (ref 65–99)
Glucose, Bld: 114 mg/dL — ABNORMAL HIGH (ref 65–99)
POTASSIUM: 2.2 mmol/L — AB (ref 3.5–5.1)
Potassium: 2.1 mmol/L — CL (ref 3.5–5.1)
Potassium: 2.9 mmol/L — ABNORMAL LOW (ref 3.5–5.1)
SODIUM: 129 mmol/L — AB (ref 135–145)
Sodium: 133 mmol/L — ABNORMAL LOW (ref 135–145)
Sodium: 131 mmol/L — ABNORMAL LOW (ref 135–145)

## 2017-11-23 LAB — CBC
HEMATOCRIT: 30.8 % — AB (ref 35.0–47.0)
Hemoglobin: 10.5 g/dL — ABNORMAL LOW (ref 12.0–16.0)
MCH: 29.1 pg (ref 26.0–34.0)
MCHC: 34.1 g/dL (ref 32.0–36.0)
MCV: 85.6 fL (ref 80.0–100.0)
PLATELETS: 178 10*3/uL (ref 150–440)
RBC: 3.6 MIL/uL — AB (ref 3.80–5.20)
RDW: 18.1 % — AB (ref 11.5–14.5)
WBC: 15.6 10*3/uL — ABNORMAL HIGH (ref 3.6–11.0)

## 2017-11-23 LAB — TROPONIN I
TROPONIN I: 0.09 ng/mL — AB (ref <0.03)
Troponin I: 0.08 ng/mL (ref <0.03)

## 2017-11-23 MED ORDER — FAMOTIDINE IN NACL 20-0.9 MG/50ML-% IV SOLN
20.0000 mg | INTRAVENOUS | Status: DC
  Administered 2017-11-23: 20 mg via INTRAVENOUS
  Filled 2017-11-23: qty 50

## 2017-11-23 MED ORDER — MAGNESIUM SULFATE IN D5W 1-5 GM/100ML-% IV SOLN
1.0000 g | Freq: Once | INTRAVENOUS | Status: AC
  Administered 2017-11-23: 1 g via INTRAVENOUS
  Filled 2017-11-23: qty 100

## 2017-11-23 MED ORDER — POTASSIUM CHLORIDE 10 MEQ/100ML IV SOLN
10.0000 meq | INTRAVENOUS | Status: AC
  Administered 2017-11-23 (×5): 10 meq via INTRAVENOUS
  Filled 2017-11-23 (×5): qty 100

## 2017-11-23 MED ORDER — FAMOTIDINE IN NACL 20-0.9 MG/50ML-% IV SOLN: 20.0000 mg | INTRAVENOUS | Status: DC

## 2017-11-23 MED ORDER — POTASSIUM CHLORIDE 10 MEQ/100ML IV SOLN
10.0000 meq | INTRAVENOUS | Status: AC
  Administered 2017-11-23 (×2): 10 meq via INTRAVENOUS

## 2017-11-23 MED ORDER — MAGNESIUM SULFATE 4 GM/100ML IV SOLN
4.0000 g | INTRAVENOUS | Status: AC
  Administered 2017-11-23 (×2): 4 g via INTRAVENOUS
  Filled 2017-11-23 (×2): qty 100

## 2017-11-23 NOTE — Procedures (Addendum)
SURGICAL OPERATIVE REPORT   DATE OF PROCEDURE: 09/20/2017   ATTENDING Surgeon(s): Scherrie Gerlach. [name], MD   ANESTHESIA: Local   PRE-OPERATIVE DIAGNOSIS: Chronic stage 4 sacral-coccygeal pressure wound (icd-10's: L89.154)   POST-OPERATIVE DIAGNOSIS: Chronic stage 4 sacral-coccygeal pressure wound (icd-10's: L89.154)   PROCEDURE(S):  1.) Sharp excisional debridement of Chronic stage 4 sacral-coccygeal pressure wound (cpt's: 11044, 11047 x 2)   INTRAOPERATIVE FINDINGS: 9 cm x 6 cm Stage 4 sacrococcygeal pressure wound, including 1-2 cm of circumferential undermining, for which nonviable skin was excised using primarily scissors and forceps with scalpel; soft tissue was debrided while 65 - 75% of the wound was involving bone with little of the bone itself able to be debrided   INTRAVENOUS FLUIDS: 0 mL crystalloid    ESTIMATED BLOOD LOSS: Minimal (<20 mL)    URINE OUTPUT: No Foley catheter    SPECIMENS: None   IMPLANTS: None   DRAINS: None   COMPLICATIONS: None apparent   CONDITION AT END OF PROCEDURE: Hemodynamically stable and awake   DISPOSITION OF PATIENT: Remaining in med-surg bed, watching tv   INDICATIONS FOR PROCEDURE:  Patient is an 84 y.o. female who presented to [facility] ED for evaluation of nausea with non-bloody emesis, UTI, and drainage from her chronic stage 4 sacral pressure wound with underlying chronic osteomyelitis. Patient reports she became deconditioned while hospitalized in [place] 5 - 6 months ago (06/2017 - 07/2017), before which she used to live at an assisted living facility and was ambulatory using a walker, but after which she became progressively weakened and immobile, spending 5 - 8 hours per day in a wheelchair. This past March, an x-ray demonstrated inferior sacral osteomyelitis, for which she was prescribed doxycycline. Her wound has been managed by debridement at wound care center x3 per week and she was evaluated  1 month ago by ID, but her wound has reportedly continued to worsen. All risks, benefits, and alternatives to sharp excisional debridement of her chronic stage 4 sacral-coccygeal pressure wound were discussed with the patient, all of patient's questions were answered to her expressed satisfaction, and informed consent was accordingly obtained and documented.   DETAILS OF PROCEDURE: Remaining in patient's med-surg bed, watching tv, patient was appropriately identified. Operative site was prepped and draped in the usual fashion, and following a brief time out, the wound was sharply debrided using primarily forceps and scissors with scalpel as needed. Necrosis extended to and included bone, and at least 1 - 2 cm of circumferential undermining was also noted with any non-viable soft tissue excised likewise. Direct pressure was then applied for hemostasis. Surrounding skin was cleaned and dried, and a sterile slightly moist gauze, dry gauze, and adhesive dressing was applied. Patient was then safely able to be left in her same condition, pleasantly watching tv, as she was pre-procedure and during procedure.   I was present for all aspects of the above procedure, and no operative complications were apparent.

## 2017-11-23 NOTE — Progress Notes (Signed)
[facility] - [place] at [facility]   PATIENT NAME: [name]    MR#:  [id number]  DATE OF BIRTH:  04-05-1929  SUBJECTIVE:   Patient presented with nausea and stage IV sacral decubitus wound infection  REVIEW OF SYSTEMS:    Review of Systems  Constitutional: Negative for fever, chills weight loss HENT: Negative for ear pain, nosebleeds, congestion, facial swelling, rhinorrhea, neck pain, neck stiffness and ear discharge.   Respiratory: Negative for cough, shortness of breath, wheezing  Cardiovascular: Negative for chest pain, palpitations and leg swelling.  Gastrointestinal: Negative for heartburn, abdominal pain, vomiting, diarrhea or consitpation Nausea improving Genitourinary: Negative for dysuria, urgency, frequency, hematuria Musculoskeletal: Negative for back pain or joint pain Neurological: Negative for dizziness, seizures, syncope, focal weakness,  numbness and headaches.  Hematological: Does not bruise/bleed easily.  Psychiatric/Behavioral: Negative for hallucinations, confusion, dysphoric mood Skin: Chronic stage IV sacral decubitus wound   Tolerating Diet: yes      DRUG ALLERGIES:  No Known Allergies  VITALS:  Blood pressure 119/71, pulse 65, temperature 98.1 F (36.7 C), temperature source Oral, resp. rate 16, height 5\' 1"  (1.549 m), weight 65.4 kg (144 lb 3.2 oz), SpO2 94 %.  PHYSICAL EXAMINATION:  Constitutional: Appears well-developed and well-nourished. No distress. HENT: Normocephalic. Oropharynx is clear and moist.  Eyes: Conjunctivae and EOM are normal. PERRLA, no scleral icterus.  Neck: Normal ROM. Neck supple. No JVD. No tracheal deviation. CVS: RRR, S1/S2 +, no murmurs, no gallops, no carotid bruit.  Pulmonary: Effort and breath sounds normal, no stridor, rhonchi, wheezes, rales.  Abdominal: Soft. BS +,  no distension, tenderness, rebound or guarding.  Musculoskeletal: Normal range of motion. No edema and no tenderness.   Neuro: Alert. CN 2-12 grossly intact. No focal deficits. Skin: Stage IV sacral decubitus ulcer, present on admission with some mild exudate noted Psychiatric: Normal mood and affect.      LABORATORY PANEL:   CBC Recent Labs  Lab 11/23/17 0226  WBC 15.6*  HGB 10.5*  HCT 30.8*  PLT 178   ------------------------------------------------------------------------------------------------------------------  Chemistries  Recent Labs  Lab 11/22/17 1658  11/23/17 0808  NA 132*   < > 131*  K 2.0*   < > 2.2*  CL 93*   < > 97*  CO2 27   < > 23  GLUCOSE 128*   < > 114*  BUN 27*   < > 21*  CREATININE 2.02*   < > 1.61*  CALCIUM 4.7*   < > 4.9*  MG  --    < > 0.9*  AST 44*  --   --   ALT 15  --   --   ALKPHOS 85  --   --   BILITOT 0.4  --   --    < > = values in this interval not displayed.   ------------------------------------------------------------------------------------------------------------------  Cardiac Enzymes Recent Labs  Lab 11/22/17 2004 11/23/17 0226 11/23/17 0811  TROPONINI 0.11* 0.09* 0.08*   ------------------------------------------------------------------------------------------------------------------  RADIOLOGY:  Dg Chest Port 1 View  Result Date: 11/22/2017 CLINICAL DATA:  Sepsis EXAM: PORTABLE CHEST 1 VIEW COMPARISON:  11/22/2017, 09/05/2009 FINDINGS: Mild diffuse interstitial opacity likely chronic change. No focal consolidation or effusion. Mild cardiomegaly. Aortic atherosclerosis. No pneumothorax. Surgical clips in the left upper quadrant. IMPRESSION: No active disease.  Cardiomegaly Electronically Signed   By: 11/05/2009 M.D.   On: 11/22/2017 20:08   Dg Chest Portable 1 View  Result Date: 11/22/2017 CLINICAL DATA:  84 y/o F;  stage IV sacral wound, vomiting, sepsis evaluation. EXAM: PORTABLE CHEST 1 VIEW COMPARISON:  09/05/2009 chest radiograph. FINDINGS: Stable mild cardiomegaly. Calcific aortic atherosclerosis. No focal consolidation,  effusion, or pneumothorax identified. No acute osseous abnormality is evident. IMPRESSION: No active disease.  Stable cardiomegaly and aortic atherosclerosis. Electronically Signed   By: Mitzi Hansen M.D.   On: 11/22/2017 18:41   Dg Abd 2 Views  Result Date: 11/22/2017 CLINICAL DATA:  84 y/o  F; nausea and sacral wound. EXAM: ABDOMEN - 2 VIEW COMPARISON:  None. FINDINGS: Left hip hemiarthroplasty without periprosthetic lucency or fracture. Vascular calcifications. Nonobstructive bowel gas pattern. Surgical changes in the left upper and right lower quadrants. Advanced degenerative changes of the lumbar spine. IMPRESSION: Nonobstructive bowel gas pattern. Electronically Signed   By: Mitzi Hansen M.D.   On: 11/22/2017 18:11     ASSESSMENT AND PLAN:   84 year old female who is predominantly bedbound who presents from nursing home due to nausea and worsening of chronic sacral decubitus ulcer   1.  Acute on chronic infected sacral decubitus ulcer stage IV: Patient advised by surgery and plan for debridement today Patient does not meet criteria for sepsis Continue vancomycin and Zosyn and follow-up on cultures  2.  Severe electrolyte abnormalities: Pharmacy consultation for replacement  3.  Elevated troponin: This is due to demand ischemia.  Patient has ruled out for ACS  4.  Protein calorie malnutrition: Dietary consultation requested  5.  Hyperlipidemia: Continue pravastatin 6.  Hyponatremia: HCTZ discontinued for now Follow sodium level  7.  Essential hypertension: Patient blood pressure medications on hold for now Follow blood pressure 8.  Diabetes: Continue sliding scale  Management plans discussed with the patient and she is in agreement.  CODE STATUS: full  TOTAL TIME TAKING CARE OF THIS PATIENT: 30 minutes.     POSSIBLE D/C 2-3 days, DEPENDING ON CLINICAL CONDITION.   Rael Tilly M.D on 11/23/2017 at 11:44 AM  Between 7am to 6pm - Pager -  [phone] After 6pm go to [email or web address] - password [email or web address]  [facility]  Office  [phone]  CC: Primary care physician; Carmin Richmond, MD  Note: This dictation was prepared with Dragon dictation along with smaller phrase technology. Any transcriptional errors that result from this process are unintentional.

## 2017-11-23 NOTE — Progress Notes (Signed)
Pharmacy consulted for electrolyte replacement protocol:   Goal of therapy: Electrolytes within normal limits:  K 3.5 - 5.1 Corrected Ca 8.9 - 10.3 Phos 2.5 - 4.6 Mg 1.7 - 2.4   Assessment: Lab Results  Component Value Date   CREATININE 1.61 (H) 11/23/2017   BUN 21 (H) 11/23/2017   NA 131 (L) 11/23/2017   K 2.2 (LL) 11/23/2017   CL 97 (L) 11/23/2017   CO2 23 11/23/2017  Mg 0.9  Plan: Magnesium sulfate 4gm iv q4h x 2 doses, along with potassium chloride iv q1h x 5 doses. Recheck electrolytes 6 hours later at 2200 per protocol. Pharmacy to follow.   Luan Pulling, PharmD, MBA, [employer] Clinical Pharmacist [facility]

## 2017-11-23 NOTE — Consult Note (Signed)
WOC Nurse wound consult note Reason for Consult: Stage 4 pressure injury. Seen earlier today by Dr. [name] who will plan to debride at bedside tomorrow. Wound type:Pressure Pressure Injury POA: Yes Measurement: 6cm x 8cm x 2.4cm. Bone is directly palpable. Wound bed: pale pink, with thin yellow sough covering wound bed Drainage (amount, consistency, odor) light yellow exudate on old dressing Periwound: erythematous with 3.5cm round area of deeper hue to the right (2-5 o'clock) of the central sacral ulcer. Area is warmer than other surrounding tissue and is consistent with a DTPI. Dressing procedure/placement/frequency: Twice daily NS dressings are ordered with instruction to turn and reposition patient despite being on a therapeutic mattress with low air loss feature. Bilateral heel boots are provided.    WOC Nurse ostomy consult note Stoma type/location: RLQ colostomy Stomal assessment/size: 1 and 1/4 inches round, red, moist (viewed through intact pouching system) Peristomal assessment: not seen today Treatment options for stomal/peristomal skin: N/A Output: liquid light brown eddluent Ostomy pouching: 2pc.2 and 3/4 inch pouching system in place today, may decrease to 2 and 1/4 inch system with subsequent changes  Education provided: None Enrolled patient in [employer] DC program: No. Patient was residing in a out of state SNF prior to admission.   WOC nursing team will not follow, but will remain available to this patient, the nursing and medical teams.  Please re-consult if needed. Thanks, Ladona Mow, MSN, RN, GNP, [facility]  Pager# [phone]

## 2017-11-23 NOTE — Progress Notes (Signed)
Family Meeting Note  Advance Directive:no  Today a meeting took place with the Patient.   The following clinical team members were present during this meeting:MD  The following were discussed:Patient's diagnosis:wound infection  , Patient's progosis: Unable to determine and Goals for treatment: Full Code  Additional follow-up to be provided: chaplain consult for ACP planning  Time spent during discussion:16 minutes  [name], Patricia Pesa, MD

## 2017-11-23 NOTE — Consult Note (Signed)
Cardiology consulted for elevated troponin.  This is a chronically ill and debilitated 60F with stage IV sacral decubitus ulcer and likely osteomyelitis admitted with sepsis.  Troponin is mildly elevated and EKG is without ischemic changes.  She has no chest pain and is not a candidate for any cardiac interventions.  We do not have anything to add and agree that this is not ACS.  Please call if she develops ischemic or other cardiac symptoms.  We will be happy to see her in the outpatient setting for management of her chronic cardiac conditions.  She will not be billed for consult.  Jadeyn Hargett C. Duke Salvia, MD, [facility] 11/23/2017 3:21 PM

## 2017-11-24 ENCOUNTER — Inpatient Hospital Stay: Payer: Self-pay

## 2017-11-24 DIAGNOSIS — L89154 Pressure ulcer of sacral region, stage 4: Secondary | ICD-10-CM

## 2017-11-24 LAB — URINE CULTURE
Culture: >=100000 — AB
Special Requests: NORMAL

## 2017-11-24 LAB — TSH: TSH: 1.858 u[IU]/mL (ref 0.350–4.500)

## 2017-11-24 LAB — BASIC METABOLIC PANEL
Anion gap: 10 (ref 5–15)
BUN: 21 mg/dL — ABNORMAL HIGH (ref 6–20)
CALCIUM: 5.3 mg/dL — AB (ref 8.9–10.3)
CO2: 22 mmol/L (ref 22–32)
Chloride: 97 mmol/L — ABNORMAL LOW (ref 101–111)
Creatinine, Ser: 1.52 mg/dL — ABNORMAL HIGH (ref 0.44–1.00)
GFR calc non Af Amer: 30 mL/min — ABNORMAL LOW (ref >60)
GFR, EST AFRICAN AMERICAN: 35 mL/min — AB (ref >60)
Glucose, Bld: 88 mg/dL (ref 65–99)
Potassium: 3.4 mmol/L — ABNORMAL LOW (ref 3.5–5.1)
Sodium: 129 mmol/L — ABNORMAL LOW (ref 135–145)

## 2017-11-24 LAB — GLUCOSE, CAPILLARY
GLUCOSE-CAPILLARY: 79 mg/dL (ref 65–99)
Glucose-Capillary: 163 mg/dL — ABNORMAL HIGH (ref 65–99)
Glucose-Capillary: 119 mg/dL — ABNORMAL HIGH (ref 65–99)
Glucose-Capillary: 170 mg/dL — ABNORMAL HIGH (ref 65–99)

## 2017-11-24 LAB — MAGNESIUM: Magnesium: 3.1 mg/dL — ABNORMAL HIGH (ref 1.7–2.4)

## 2017-11-24 MED ORDER — POTASSIUM CHLORIDE 10 MEQ/100ML IV SOLN
10.0000 meq | INTRAVENOUS | Status: AC
  Administered 2017-11-24 (×4): 10 meq via INTRAVENOUS
  Filled 2017-11-24 (×4): qty 100

## 2017-11-24 MED ORDER — POLYVINYL ALCOHOL 1.4 % OP SOLN
1.0000 [drp] | OPHTHALMIC | Status: DC | PRN
  Administered 2017-11-24 – 2017-11-25 (×2): 1 [drp] via OPHTHALMIC
  Filled 2017-11-24: qty 15

## 2017-11-24 MED ORDER — PREMIER PROTEIN SHAKE
11.0000 [oz_av] | Freq: Two times a day (BID) | ORAL | Status: DC
  Administered 2017-11-24 – 2017-11-26 (×5): 11 [oz_av] via ORAL

## 2017-11-24 MED ORDER — POTASSIUM CHLORIDE CRYS ER 20 MEQ PO TBCR
20.0000 meq | EXTENDED_RELEASE_TABLET | Freq: Once | ORAL | Status: AC
  Administered 2017-11-24: 20 meq via ORAL
  Filled 2017-11-24: qty 1

## 2017-11-24 MED ORDER — FAMOTIDINE 20 MG PO TABS
20.0000 mg | ORAL_TABLET | Freq: Two times a day (BID) | ORAL | Status: DC
  Administered 2017-11-24 (×2): 20 mg via ORAL
  Filled 2017-11-24 (×2): qty 1

## 2017-11-24 MED ORDER — VITAMIN C 500 MG PO TABS
500.0000 mg | ORAL_TABLET | Freq: Two times a day (BID) | ORAL | Status: DC
  Administered 2017-11-24 – 2017-11-26 (×5): 500 mg via ORAL
  Filled 2017-11-24 (×7): qty 1

## 2017-11-24 MED ORDER — ERTAPENEM SODIUM 1 G IJ SOLR
500.0000 mg | INTRAMUSCULAR | Status: DC
  Administered 2017-11-24 – 2017-11-27 (×4): 500 mg via INTRAVENOUS
  Filled 2017-11-24 (×6): qty 0.5

## 2017-11-24 NOTE — Progress Notes (Signed)
Pharmacy consulted for electrolyte replacement protocol:   Goal of therapy: Electrolytes within normal limits:  K 3.5 - 5.1 Corrected Ca 8.9 - 10.3 Phos 2.5 - 4.6 Mg 1.7 - 2.4   Assessment: Lab Results  Component Value Date   CREATININE 1.52 (H) 11/24/2017   BUN 21 (H) 11/24/2017   NA 129 (L) 11/24/2017   K 3.4 (L) 11/24/2017   CL 97 (L) 11/24/2017   CO2 22 11/24/2017    Plan: Ordered KCL PO x 1 dose.  Recheck electrolytes with am labs.   Stormy Card, [facility] Clinical Pharmacist 11/24/2017, 8:26 AM

## 2017-11-24 NOTE — Progress Notes (Addendum)
Initial Nutrition Assessment  DOCUMENTATION CODES:   Non-severe (moderate) malnutrition in context of chronic illness  INTERVENTION:  Will downgrade diet to dysphagia 3 (mechanical soft) with thin liquids as patient reports this is her usual diet in setting of difficulty chewing.  Provide Premier Protein po BID, each supplement provides 160 kcal and 30 grams of protein.  Continue MVI daily.  Recommend increasing vitamin C to 500 mg BID.   Continue zinc sulfate 220 mg daily. Of note, zinc supplementation can lead to copper deficiency. Recommend monitoring serum copper level periodically while patient is on zinc supplementation.  Discussed increased needs for calories and protein to promote wound healing and prevent any further unintentional weight loss or loss of lean body mass. When patient is catabolic and not consuming enough calories/protein to maintain her weight and muscle mass, her wound will not be able to heal well.  NUTRITION DIAGNOSIS:   Moderate Malnutrition related to chronic illness(deconditioning/decreased appetite following hospitalization in 06/2017, stage IV pressure ulcer with osteomyelitis) as evidenced by mild fat depletion, mild muscle depletion.  GOAL:   Patient will meet greater than or equal to 90% of their needs  MONITOR:   PO intake, Supplement acceptance, Labs, Weight trends, Skin, I & O's  REASON FOR ASSESSMENT:   Consult Assessment of nutrition requirement/status  ASSESSMENT:   84 year old female with PMHx of DM, anemia, HTN, HLD, CAD with mitral valve insufficiency and aortic valve stenosis with cardiomegaly, rheumatoid arthritis, hx of rectal cancer s/p surgical resection and creation of colostomy in 1996 who presented from [facility] in [place] with N/V, ESBL E. coli UTI, electrolyte abnormalities, and acute on chronic infected decubitus ulcer stage IV s/p debridement on 5/25.   Met with patient at bedside. She reports she has had a  decreased appetite since December. She reports she had a surgery in December and since then she has been less mobile and has not been eating well at meals. She reports her wound started developing in March. She is finishing about 50% of her meals or less per report. She has difficulty chewing her food, especially meat, and reports she is on a mechanical soft diet with thin liquids at her facility. She presented with N/V but reports it is better now. She had eggs, grits, and toast for breakfast this morning and has tolerated so far. She reports she used to drink Boost at her facility but it was making her sick. She can tolerate Ensure but would prefer to try another supplement here. She is amenable to trying Premier Protein.  Patient reports UBW is around 150 lbs and she has not lost any weight yet. She did not realize she was currently 144.2 lbs. She has lost 5.8 lbs (3.9% body weight) over an unknown time period. Limited weight history in chart.  Meal Completion: 50% per chart  Medications reviewed and include: famotidine, Novolog 0-9 units TID, Novolog 0-5 units QHS, MVI daily, Florastor 250 mg daily, vitamin C 250 mg daily, zinc sulfate 220 mg daily, Invanz, vancomycin.  Labs reviewed: CBG 79-186, Sodium 129, Potassium 3.4, Chloride 97, BUN 21, Creatinine 1.52, Calcium 5.3. On 5/24 there was a calcium of 4.7 (corrects to 6.38 with albumin of 1.9).  Discussed with RN.  NUTRITION - FOCUSED PHYSICAL EXAM:    Most Recent Value  Orbital Region  Mild depletion  Upper Arm Region  Mild depletion  Thoracic and Lumbar Region  No depletion  Buccal Region  Mild depletion  Temple Region  Mild  depletion  Clavicle Bone Region  No depletion  Clavicle and Acromion Bone Region  Mild depletion  Scapular Bone Region  Unable to assess  Dorsal Hand  Mild depletion  Patellar Region  Unable to assess  Anterior Thigh Region  Unable to assess  Posterior Calf Region  Unable to assess  Edema (RD Assessment)  Mild   Hair  Reviewed  Eyes  Reviewed  Mouth  Reviewed  Skin  Reviewed  Nails  Reviewed     Diet Order:   Diet Order           Diet heart healthy/carb modified Room service appropriate? Yes; Fluid consistency: Thin  Diet effective now          EDUCATION NEEDS:   Education needs have been addressed  Skin:  Skin Assessment: Skin Integrity Issues: Skin Integrity Issues:: Stage IV Stage IV: coccyx (6cm x 8cm x 2.4cm with bone directly palpable per [place] RN note)  Last BM:  11/23/2017 - colostomy  Height:   Ht Readings from Last 1 Encounters:  11/22/17 '5\' 1"'  (1.549 m)    Weight:   Wt Readings from Last 1 Encounters:  11/22/17 144 lb 3.2 oz (65.4 kg)    Ideal Body Weight:  47.7 kg  BMI:  Body mass index is 27.25 kg/m.  Estimated Nutritional Needs:   Kcal:  1800-2000  Protein:  90-100 grams  Fluid:  1.6-1.9 L/day  Willey Blade, MS, RD, LDN Office: [phone] Pager: [phone] After Hours/Weekend Pager: [phone]

## 2017-11-24 NOTE — Clinical Social Work Note (Signed)
Clinical Social Work Assessment  Patient Details  Name: [name] MRN: [id number] Date of Birth: 1929-06-13  Date of referral:                  Reason for consult:                   Permission sought to share information with:    Permission granted to share information::     Name::        Agency::     Relationship::     Contact Information:     Housing/Transportation Living arrangements for the past 2 months:    Source of Information:    Patient Interpreter Needed:    Criminal Activity/Legal Involvement Pertinent to Current Situation/Hospitalization:    Significant Relationships:    Lives with:    Do you feel safe going back to the place where you live?    Need for family participation in patient care:     Care giving concerns:  Patient admitted from a SNF   Social Worker assessment / plan:  The patient was in the process of wound care when the CSW attempted to visit. The CSW attempted to call both listed contacts with no ability to leave a voice message and no answer. The CSW contacted the patient's SNF of origin for information. The patient is a LTC resident at [facility] in [facility], and according to her nurse, she can return when medically stable. The patient will have a PICC placed for long term IV antibiotics. The CSW updated the SNF of this change.  The CSW will continue to follow to contact family, discuss discharge face-to-face with the patient, and to facilitate discharge to her SNF. The patient will most likely discharge on Tuesday per the medical team.  Employment status:    Insurance information:    PT Recommendations:    Information / Referral to community resources:     Patient/Family's Response to care: The patient was unavailable.  Patient/Family's Understanding of and Emotional Response to Diagnosis, Current Treatment, and Prognosis:  The patient will have a PICC placed. The patient can return to her SNF. According to chart notes, the patient is in  agreement with treatment.  Emotional Assessment Appearance:    Attitude/Demeanor/Rapport:    Affect (typically observed):    Orientation:    Alcohol / Substance use:    Psych involvement (Current and /or in the community):     Discharge Needs  Concerns to be addressed:    Readmission within the last 30 days:    Current discharge risk:    Barriers to Discharge:      Judi Cong, LCSW 11/24/2017, 4:24 PM

## 2017-11-24 NOTE — Progress Notes (Signed)
[facility] [place] at [facility]   PATIENT NAME: [name]    MR#:  [id number]  DATE OF BIRTH:  1928/12/11  SUBJECTIVE:   Patient had sacral wound debrided yesterday  doing ok this am no issues  REVIEW OF SYSTEMS:    Review of Systems  Constitutional: Negative for fever, chills weight loss HENT: Negative for ear pain, nosebleeds, congestion, facial swelling, rhinorrhea, neck pain, neck stiffness and ear discharge.   Respiratory: Negative for cough, shortness of breath, wheezing  Cardiovascular: Negative for chest pain, palpitations and leg swelling.  Gastrointestinal: Negative for heartburn, abdominal pain, vomiting, diarrhea or consitpation Nausea improving Genitourinary: Negative for dysuria, urgency, frequency, hematuria Musculoskeletal: Negative for back pain or joint pain Neurological: Negative for dizziness, seizures, syncope, focal weakness,  numbness and headaches.  Hematological: Does not bruise/bleed easily.  Psychiatric/Behavioral: Negative for hallucinations, confusion, dysphoric mood Skin: Chronic stage IV sacral decubitus wound   Tolerating Diet: yes      DRUG ALLERGIES:  No Known Allergies  VITALS:  Blood pressure 104/86, pulse (!) 59, temperature 98.5 F (36.9 C), resp. rate 20, height 5\' 1"  (1.549 m), weight 65.4 kg (144 lb 3.2 oz), SpO2 93 %.  PHYSICAL EXAMINATION:  Constitutional: Appears well-developed and well-nourished. No distress. HENT: Normocephalic. Oropharynx is clear and moist.  Eyes: Conjunctivae and EOM are normal. PERRLA, no scleral icterus.  Neck: Normal ROM. Neck supple. No JVD. No tracheal deviation. CVS: RRR, S1/S2 +, no murmurs, no gallops, no carotid bruit.  Pulmonary: Effort and breath sounds normal, no stridor, rhonchi, wheezes, rales.  Abdominal: Soft. BS +,  no distension, tenderness, rebound or guarding.  Right lower quadrant ostomy Musculoskeletal: Normal range of motion. No edema and no tenderness.   Neuro: Alert. CN 2-12 grossly intact. No focal deficits. Skin: Stage IV sacral decubitus ulcer, present on admission with some mild exudate noted Psychiatric: Normal mood and affect.      LABORATORY PANEL:   CBC Recent Labs  Lab 11/23/17 0226  WBC 15.6*  HGB 10.5*  HCT 30.8*  PLT 178   ------------------------------------------------------------------------------------------------------------------  Chemistries  Recent Labs  Lab 11/22/17 1658  11/24/17 0549  NA 132*   < > 129*  K 2.0*   < > 3.4*  CL 93*   < > 97*  CO2 27   < > 22  GLUCOSE 128*   < > 88  BUN 27*   < > 21*  CREATININE 2.02*   < > 1.52*  CALCIUM 4.7*   < > 5.3*  MG  --    < > 3.1*  AST 44*  --   --   ALT 15  --   --   ALKPHOS 85  --   --   BILITOT 0.4  --   --    < > = values in this interval not displayed.   ------------------------------------------------------------------------------------------------------------------  Cardiac Enzymes Recent Labs  Lab 11/22/17 2004 11/23/17 0226 11/23/17 0811  TROPONINI 0.11* 0.09* 0.08*   ------------------------------------------------------------------------------------------------------------------  RADIOLOGY:  Dg Chest Port 1 View  Result Date: 11/22/2017 CLINICAL DATA:  Sepsis EXAM: PORTABLE CHEST 1 VIEW COMPARISON:  11/22/2017, 09/05/2009 FINDINGS: Mild diffuse interstitial opacity likely chronic change. No focal consolidation or effusion. Mild cardiomegaly. Aortic atherosclerosis. No pneumothorax. Surgical clips in the left upper quadrant. IMPRESSION: No active disease.  Cardiomegaly Electronically Signed   By: 11/05/2009 [name].   On: 11/22/2017 20:08   Dg Chest Portable 1 View  Result Date: 11/22/2017 CLINICAL DATA:  84 y/o F; stage IV sacral wound, vomiting, sepsis evaluation. EXAM: PORTABLE CHEST 1 VIEW COMPARISON:  09/05/2009 chest radiograph. FINDINGS: Stable mild cardiomegaly. Calcific aortic atherosclerosis. No focal consolidation,  effusion, or pneumothorax identified. No acute osseous abnormality is evident. IMPRESSION: No active disease.  Stable cardiomegaly and aortic atherosclerosis. Electronically Signed   By: Mitzi Hansen [name].   On: 11/22/2017 18:41   Dg Abd 2 Views  Result Date: 11/22/2017 CLINICAL DATA:  84 y/o  F; nausea and sacral wound. EXAM: ABDOMEN - 2 VIEW COMPARISON:  None. FINDINGS: Left hip hemiarthroplasty without periprosthetic lucency or fracture. Vascular calcifications. Nonobstructive bowel gas pattern. Surgical changes in the left upper and right lower quadrants. Advanced degenerative changes of the lumbar spine. IMPRESSION: Nonobstructive bowel gas pattern. Electronically Signed   By: Mitzi Hansen [name].   On: 11/22/2017 18:11   [place] Ekg Site Rite  Result Date: 11/24/2017 If Site Rite image not attached, placement could not be confirmed due to current cardiac rhythm.    ASSESSMENT AND PLAN:   84 year old female who is predominantly bedbound who presents from nursing home due to nausea and worsening of chronic sacral decubitus ulcer   1.  Acute on chronic infected sacral decubitus ulcer stage IV: Patient is status post debridement of chronic stage IV sacral coccygeal pressure wound Continue local wound care and repositioning a patient  2.  Severe electrolyte abnormalities: Improved electrolytes  3.  Elevated troponin: This is due to demand ischemia.  Patient has ruled out for ACS Appreciate cardiology consultation  4.  Protein calorie malnutrition: Dietary consultation requested  5.  Hyperlipidemia: Continue pravastatin 6.  Hyponatremia: HCTZ discontinued for now Sodium level stable  7.  Essential hypertension: Patient blood pressure medications on hold for now due to low/normal blood pressure  Follow blood pressure 8.  Diabetes: Continue sliding scale  9.  ESBL E. coli UTI: Patient will need PICC line and long-term IV antibiotics ID consultation  requested Pharmacy consultation for ESBL UTI  Management plans discussed with the patient and she is in agreement.  Consider outpatient palliative care consult   CODE STATUS: full  TOTAL TIME TAKING CARE OF THIS PATIENT: 24 minutes.     POSSIBLE D/C 2 days, DEPENDING ON CLINICAL CONDITION.   Celestial Barnfield [name] on 11/24/2017 at 10:42 AM  Between 7am to 6pm - Pager - [phone] After 6pm go to [email or web address] - password [employer]  [facility] [place] [facility]  Office  [phone]  CC: Primary care physician; Carmin Richmond, MD  Note: This dictation was prepared with Dragon dictation along with smaller phrase technology. Any transcriptional errors that result from this process are unintentional.

## 2017-11-24 NOTE — Progress Notes (Signed)
Pharmacy Antibiotic Note  [name] is a 85 y.o. female admitted on 11/22/2017 with UTI.  Pharmacy has been consulted for ertapenem dosing.  Plan: ertapenem 500mg  iv q24h   Height: 5\' 1"  (154.9 cm) Weight: 144 lb 3.2 oz (65.4 kg) IBW/kg (Calculated) : 47.8  Temp (24hrs), Avg:98.1 F (36.7 C), Min:97.6 F (36.4 C), Max:98.5 F (36.9 C)  Recent Labs  Lab 11/22/17 1658 11/22/17 1723 11/22/17 2004 11/23/17 0226 11/23/17 0808 11/23/17 2302 11/24/17 0549  WBC 17.6*  --  16.3* 15.6*  --   --   --   CREATININE 2.02*  --  1.94* 1.73* 1.61* 1.54* 1.52*  LATICACIDVEN  --  1.3  --   --   --   --   --     Estimated Creatinine Clearance: 23 mL/min (A) (by C-G formula based on SCr of 1.52 mg/dL (H)).    No Known Allergies  Antimicrobials this admission: Anti-infectives (From admission, onward)   Start     Dose/Rate Route Frequency Ordered Stop   11/24/17 1100  ertapenem (INVANZ) 500 mg in sodium chloride 0.9 % 50 mL IVPB     500 mg 100 mL/hr over 30 Minutes Intravenous Every 24 hours 11/24/17 1057     11/23/17 1400  vancomycin (VANCOCIN) IVPB 750 mg/150 ml premix     750 mg 150 mL/hr over 60 Minutes Intravenous Every 36 hours 11/22/17 1918     11/23/17 0600  piperacillin-tazobactam (ZOSYN) IVPB 3.375 g  Status:  Discontinued     3.375 g 12.5 mL/hr over 240 Minutes Intravenous Every 12 hours 11/22/17 1914 11/24/17 1057   11/22/17 2000  piperacillin-tazobactam (ZOSYN) IVPB 3.375 g  Status:  Discontinued    Note to Pharmacy:  Pharmacy to dose   3.375 g 100 mL/hr over 30 Minutes Intravenous Every 6 hours 11/22/17 1951 11/22/17 1953   11/22/17 1800  piperacillin-tazobactam (ZOSYN) IVPB 3.375 g     3.375 g 100 mL/hr over 30 Minutes Intravenous  Once 11/22/17 1754 11/22/17 1900   11/22/17 1800  vancomycin (VANCOCIN) IVPB 1000 mg/200 mL premix     1,000 mg 200 mL/hr over 60 Minutes Intravenous  Once 11/22/17 1754 11/22/17 1929      Microbiology results: Recent Results (from  the past 240 hour(s))  Culture, blood (Routine X 2) w Reflex to ID Panel     Status: None (Preliminary result)   Collection Time: 11/22/17  4:52 PM  Result Value Ref Range Status   Specimen Description BLOOD LEFT ANTECUBITAL  Final   Special Requests   Final    BOTTLES DRAWN AEROBIC AND ANAEROBIC Blood Culture adequate volume   Culture   Final    NO GROWTH 2 DAYS Performed at [facility], [address], [place], [address] [place]    Report Status PENDING  Incomplete  Culture, blood (Routine X 2) w Reflex to ID Panel     Status: None (Preliminary result)   Collection Time: 11/22/17  4:58 PM  Result Value Ref Range Status   Specimen Description BLOOD BLOOD RIGHT HAND  Final   Special Requests   Final    BOTTLES DRAWN AEROBIC AND ANAEROBIC Blood Culture results may not be optimal due to an inadequate volume of blood received in culture bottles   Culture   Final    NO GROWTH 2 DAYS Performed at [facility], [address], [place], [address] [place]    Report Status PENDING  Incomplete  Urine culture  Status: Abnormal   Collection Time: 11/22/17  6:02 PM  Result Value Ref Range Status   Specimen Description   Final    URINE, CATHETERIZED Performed at [facility], [address], [place], [place] [address]    Special Requests   Final    Normal Performed at [facility], [address] [place], [place] [address]    Culture (A)  Final    >=100,000 COLONIES/mL ESCHERICHIA COLI Confirmed Extended Spectrum Beta-Lactamase Producer (ESBL).  In bloodstream infections from ESBL organisms, carbapenems are preferred over piperacillin/tazobactam. They are shown to have a lower risk of mortality. Performed at [facility], [address] [place], [place] [address]    Report Status 11/24/2017 FINAL  Final   Organism ID, Bacteria ESCHERICHIA COLI (A)  Final      Susceptibility   Escherichia coli - MIC*    AMPICILLIN >=32 RESISTANT  Resistant     CEFAZOLIN >=64 RESISTANT Resistant     CEFTRIAXONE >=64 RESISTANT Resistant     CIPROFLOXACIN >=4 RESISTANT Resistant     GENTAMICIN <=1 SENSITIVE Sensitive     IMIPENEM <=0.25 SENSITIVE Sensitive     NITROFURANTOIN 64 INTERMEDIATE Intermediate     TRIMETH/SULFA >=320 RESISTANT Resistant     AMPICILLIN/SULBACTAM >=32 RESISTANT Resistant     PIP/TAZO <=4 SENSITIVE Sensitive     Extended ESBL POSITIVE Resistant     * >=100,000 COLONIES/mL ESCHERICHIA COLI  MRSA PCR Screening     Status: None   Collection Time: 11/22/17  8:58 PM  Result Value Ref Range Status   MRSA by PCR NEGATIVE NEGATIVE Final    Comment:        The GeneXpert MRSA Assay (FDA approved for NASAL specimens only), is one component of a comprehensive MRSA colonization surveillance program. It is not intended to diagnose MRSA infection nor to guide or monitor treatment for MRSA infections. Performed at [facility], [address], [place], [place] [address]      Thank you for allowing pharmacy to be a part of this patient's care.  Gerre Pebbles Cillian Gwinner 11/24/2017 10:57 AM

## 2017-11-24 NOTE — Progress Notes (Signed)
Spoke to Dr Juliene Pina to clarify vital sign order. MD also clarified that pt should remain on telemetry due to Heart block and brady rhythm. No further orders at this time. Vital signs to remain per routine.

## 2017-11-24 NOTE — Progress Notes (Signed)
Pharmacy consulted for electrolyte replacement protocol:   Goal of therapy: Electrolytes within normal limits:  K 3.5 - 5.1 Corrected Ca 8.9 - 10.3 Phos 2.5 - 4.6 Mg 1.7 - 2.4   Assessment: Lab Results  Component Value Date   CREATININE 1.54 (H) 11/23/2017   BUN 22 (H) 11/23/2017   NA 129 (L) 11/23/2017   K 2.9 (L) 11/23/2017   CL 95 (L) 11/23/2017   CO2 23 11/23/2017  Mg 0.9  Plan: Magnesium sulfate 4gm iv q4h x 2 doses, along with potassium chloride iv q1h x 5 doses. Recheck electrolytes 6 hours later at 2200 per protocol. Pharmacy to follow.   05/25 @ 2200 K 2.9, Mg 3.5. Will supplement KCl 10 mEq IV x 4 and recheck w/ am labs. No mag since patient is hypermagnesemic.  Thomasene Ripple, PharmD, BCPS Clinical Pharmacist 11/24/2017

## 2017-11-24 NOTE — Progress Notes (Signed)
Spoke with Marisue Ivan RN re PICC.  States pt is not to be d/c home today and currently has 1 PIV working.  Notified plan on PICC placement 11/25/17

## 2017-11-24 NOTE — Progress Notes (Signed)
PHARMACIST - PHYSICIAN COMMUNICATION  CONCERNING: IV to Oral Route Change Policy  RECOMMENDATION: This patient is receiving famotidine by the intravenous route.  Based on criteria approved by the Pharmacy and Therapeutics Committee, the intravenous medication(s) is/are being converted to the equivalent oral dose form(s).   DESCRIPTION: These criteria include:  The patient is eating (either orally or via tube) and/or has been taking other orally administered medications for a least 24 hours  The patient has no evidence of active gastrointestinal bleeding or impaired GI absorption (gastrectomy, short bowel, patient on TNA or NPO).  If you have questions about this conversion, please contact the Pharmacy Department  []   [phone] )  ( [phone] [x]   [phone] )  [facility] []   [phone] )  [facility] []   [phone] )  [facility] []   [phone] )  [facility]   Simpson,Michael L, [facility] 11/24/2017 10:23 AM

## 2017-11-24 NOTE — NC FL2 (Signed)
[place] MEDICAID FL2 LEVEL OF CARE SCREENING TOOL     IDENTIFICATION  Patient Name: [name] Birthdate: [birth year] Sex: female Admission Date (Current Location): 11/22/2017  [place] and [place] Number:  [address] [id number] [facility] Facility and Address:  [facility], [address], [place], [place] [address]      Provider Number: [id number]  Attending Physician Name and Address:  Adrian Saran, MD  Relative Name and Phone Number:  Arby Barrette [phone])    Current Level of Care: Hospital Recommended Level of Care: Skilled Nursing Facility Prior Approval Number:    Date Approved/Denied:   PASRR Number: [id number]  Discharge Plan: SNF    Current Diagnoses: Patient Active Problem List   Diagnosis Date Noted  . Pressure injury of skin 11/23/2017  . Sepsis (HCC) 11/22/2017  . JOINT EFFUSION, LEFT KNEE 04/19/2008  . METHICILLIN RESISTANT STAPHYLOCOCCUS AUREUS INFECTION 02/19/2008  . PROSTHETIC JOINT COMPLICATION 02/19/2008  . SPLENECTOMY, HX OF 02/19/2008    Orientation RESPIRATION BLADDER Height & Weight     Self, Time, Situation  Normal Incontinent Weight: 144 lb 3.2 oz (65.4 kg) Height:  5\' 1"  (154.9 cm)  BEHAVIORAL SYMPTOMS/MOOD NEUROLOGICAL BOWEL NUTRITION STATUS      Incontinent Diet(Dysphagia 3, thin liquids)  AMBULATORY STATUS COMMUNICATION OF NEEDS Skin   Extensive Assist Verbally PU Stage and Appropriate Care                       Personal Care Assistance Level of Assistance  Bathing, Feeding, Dressing Bathing Assistance: Maximum assistance Feeding assistance: Independent Dressing Assistance: Limited assistance     Functional Limitations Info  Sight, Hearing, Speech Sight Info: Adequate Hearing Info: Adequate Speech Info: Adequate    SPECIAL CARE FACTORS FREQUENCY  (IV Antibiotics/PICC)                    Contractures Contractures Info: Not present    Additional Factors Info  Code Status,  Allergies Code Status Info: Full Allergies Info: No Known Allergies           Current Medications (11/24/2017):  This is the current hospital active medication list Current Facility-Administered Medications  Medication Dose Route Frequency Provider Last Rate Last Dose  . acetaminophen (TYLENOL) tablet 650 mg  650 mg Oral Q6H PRN Salary, Montell D, MD   650 mg at 11/23/17 1719   Or  . acetaminophen (TYLENOL) suppository 650 mg  650 mg Rectal Q6H PRN Salary, Montell D, MD      . albuterol (PROVENTIL) (2.5 MG/3ML) 0.083% nebulizer solution 3 mL  3 mL Nebulization Q6H PRN Salary, Montell D, MD      . aspirin EC tablet 325 mg  325 mg Oral Daily 11/25/17, MD   325 mg at 11/24/17 0907  . ertapenem (INVANZ) 500 mg in sodium chloride 0.9 % 50 mL IVPB  500 mg Intravenous Q24H Coffee, 11/26/17, [facility]   Stopped at 11/24/17 1315  . famotidine (PEPCID) tablet 20 mg  20 mg Oral BID 11/26/17, MD   20 mg at 11/24/17 1244  . heparin injection 5,000 Units  5,000 Units Subcutaneous Q8H Salary, Montell D, MD   5,000 Units at 11/24/17 1441  . insulin aspart (novoLOG) injection 0-5 Units  0-5 Units Subcutaneous QHS Salary, Montell D, MD      . insulin aspart (novoLOG) injection 0-9 Units  0-9 Units Subcutaneous TID WC Salary, Montell D, MD   2 Units at 11/24/17 1243  . multivitamin with  minerals tablet 1 tablet  1 tablet Oral Daily Salary, Montell D, MD   1 tablet at 11/24/17 0906  . ondansetron (ZOFRAN) tablet 4 mg  4 mg Oral Q6H PRN Salary, Montell D, MD       Or  . ondansetron (ZOFRAN) injection 4 mg  4 mg Intravenous Q6H PRN Salary, Montell D, MD   4 mg at 11/23/17 0927  . oxyCODONE-acetaminophen (PERCOCET/ROXICET) 5-325 MG per tablet 1 tablet  1 tablet Oral Q6H PRN Bertrum Sol, MD   1 tablet at 11/24/17 0707  . polyethylene glycol (MIRALAX / GLYCOLAX) packet 17 g  17 g Oral Daily PRN Salary, Montell D, MD      . polyvinyl alcohol (LIQUIFILM TEARS) 1.4 % ophthalmic solution 1 drop  1 drop Both Eyes  PRN Adrian Saran, MD   1 drop at 11/24/17 0653  . pravastatin (PRAVACHOL) tablet 40 mg  40 mg Oral Daily Salary, Montell D, MD   40 mg at 11/24/17 0906  . pregabalin (LYRICA) capsule 50 mg  50 mg Oral TID Angelina Ok D, MD   50 mg at 11/24/17 0907  . protein supplement (PREMIER PROTEIN) liquid  11 oz Oral BID BM Adrian Saran, MD   11 oz at 11/24/17 1443  . saccharomyces boulardii (FLORASTOR) capsule 250 mg  250 mg Oral Daily Salary, Montell D, MD   250 mg at 11/24/17 0907  . sodium chloride (OCEAN) 0.65 % nasal spray 2 spray  2 spray Nasal Q4H PRN Salary, Montell D, MD      . vancomycin (VANCOCIN) IVPB 750 mg/150 ml premix  750 mg Intravenous Q36H Salary, Evelena Asa, MD   Stopped at 11/23/17 1548  . vitamin C (ASCORBIC ACID) tablet 500 mg  500 mg Oral BID Mody, Sital, MD      . zinc sulfate capsule 220 mg  220 mg Oral Daily Salary, Montell D, MD   220 mg at 11/24/17 0907     Discharge Medications: Please see discharge summary for a list of discharge medications.  Relevant Imaging Results:  Relevant Lab Results:   Additional Information SS#[id number]  Judi Cong, LCSW

## 2017-11-25 DIAGNOSIS — Z515 Encounter for palliative care: Secondary | ICD-10-CM

## 2017-11-25 DIAGNOSIS — Z792 Long term (current) use of antibiotics: Secondary | ICD-10-CM

## 2017-11-25 DIAGNOSIS — Z7189 Other specified counseling: Secondary | ICD-10-CM

## 2017-11-25 DIAGNOSIS — L89150 Pressure ulcer of sacral region, unstageable: Secondary | ICD-10-CM

## 2017-11-25 LAB — HEPATIC FUNCTION PANEL
ALT: 13 U/L — ABNORMAL LOW (ref 14–54)
AST: 22 U/L (ref 15–41)
Albumin: 1.7 g/dL — ABNORMAL LOW (ref 3.5–5.0)
Alkaline Phosphatase: 80 U/L (ref 38–126)
BILIRUBIN TOTAL: 0.6 mg/dL (ref 0.3–1.2)
Total Protein: 5.8 g/dL — ABNORMAL LOW (ref 6.5–8.1)

## 2017-11-25 LAB — MAGNESIUM: Magnesium: 2.6 mg/dL — ABNORMAL HIGH (ref 1.7–2.4)

## 2017-11-25 LAB — BASIC METABOLIC PANEL
Anion gap: 8 (ref 5–15)
BUN: 29 mg/dL — AB (ref 6–20)
CHLORIDE: 99 mmol/L — AB (ref 101–111)
CO2: 23 mmol/L (ref 22–32)
CREATININE: 1.47 mg/dL — AB (ref 0.44–1.00)
Calcium: 5.9 mg/dL — CL (ref 8.9–10.3)
GFR calc Af Amer: 36 mL/min — ABNORMAL LOW (ref >60)
GFR calc non Af Amer: 31 mL/min — ABNORMAL LOW (ref >60)
Glucose, Bld: 90 mg/dL (ref 65–99)
POTASSIUM: 3.7 mmol/L (ref 3.5–5.1)
Sodium: 130 mmol/L — ABNORMAL LOW (ref 135–145)

## 2017-11-25 LAB — GLUCOSE, CAPILLARY
GLUCOSE-CAPILLARY: 119 mg/dL — AB (ref 65–99)
GLUCOSE-CAPILLARY: 137 mg/dL — AB (ref 65–99)
Glucose-Capillary: 118 mg/dL — ABNORMAL HIGH (ref 65–99)
Glucose-Capillary: 146 mg/dL — ABNORMAL HIGH (ref 65–99)

## 2017-11-25 LAB — CBC
HCT: 29.6 % — ABNORMAL LOW (ref 35.0–47.0)
HEMATOCRIT: 28 % — AB (ref 35.0–47.0)
HEMOGLOBIN: 9.5 g/dL — AB (ref 12.0–16.0)
Hemoglobin: 9.9 g/dL — ABNORMAL LOW (ref 12.0–16.0)
MCH: 29.2 pg (ref 26.0–34.0)
MCH: 29.2 pg (ref 26.0–34.0)
MCHC: 33.9 g/dL (ref 32.0–36.0)
MCHC: 33.5 g/dL (ref 32.0–36.0)
MCV: 86.4 fL (ref 80.0–100.0)
MCV: 87.2 fL (ref 80.0–100.0)
PLATELETS: 152 10*3/uL (ref 150–440)
Platelets: 148 10*3/uL — ABNORMAL LOW (ref 150–440)
RBC: 3.24 MIL/uL — ABNORMAL LOW (ref 3.80–5.20)
RBC: 3.39 MIL/uL — ABNORMAL LOW (ref 3.80–5.20)
RDW: 18.6 % — AB (ref 11.5–14.5)
RDW: 18.5 % — AB (ref 11.5–14.5)
WBC: 20.9 10*3/uL — ABNORMAL HIGH (ref 3.6–11.0)
WBC: 22.3 10*3/uL — ABNORMAL HIGH (ref 3.6–11.0)

## 2017-11-25 MED ORDER — CALCIUM CARBONATE ANTACID 500 MG PO CHEW
1.0000 | CHEWABLE_TABLET | Freq: Two times a day (BID) | ORAL | Status: AC
  Administered 2017-11-25 (×2): 200 mg via ORAL
  Filled 2017-11-25 (×2): qty 1

## 2017-11-25 MED ORDER — FAMOTIDINE 20 MG PO TABS
20.0000 mg | ORAL_TABLET | Freq: Every day | ORAL | Status: DC
  Administered 2017-11-25 – 2017-11-26 (×2): 20 mg via ORAL
  Filled 2017-11-25 (×2): qty 1

## 2017-11-25 NOTE — Progress Notes (Signed)
[facility] [place] [facility]   PATIENT NAME: [name]    MR#:  [id number]  DATE OF BIRTH:  05-Aug-1928  SUBJECTIVE:   No acute events overnight Still waiting for PICC line placement  REVIEW OF SYSTEMS:    Review of Systems  Constitutional: Negative for fever, chills weight loss HENT: Negative for ear pain, nosebleeds, congestion, facial swelling, rhinorrhea, neck pain, neck stiffness and ear discharge.   Respiratory: Negative for cough, shortness of breath, wheezing  Cardiovascular: Negative for chest pain, palpitations and leg swelling.  Gastrointestinal: Negative for heartburn, abdominal pain, vomiting, diarrhea or consitpation Nausea improving Genitourinary: Negative for dysuria, urgency, frequency, hematuria Musculoskeletal: Negative for back pain or joint pain Neurological: Negative for dizziness, seizures, syncope, focal weakness,  numbness and headaches.  Hematological: Does not bruise/bleed easily.  Psychiatric/Behavioral: Negative for hallucinations, confusion, dysphoric mood Skin: Chronic stage IV sacral decubitus wound   Tolerating Diet: yes      DRUG ALLERGIES:  No Known Allergies  VITALS:  Blood pressure (!) 114/51, pulse (!) 58, temperature 97.8 F (36.6 C), temperature source Oral, resp. rate 19, height 5\' 1"  (1.549 m), weight 65.4 kg (144 lb 3.2 oz), SpO2 97 %.  PHYSICAL EXAMINATION:  Constitutional: Appears well-developed and well-nourished. No distress. HENT: Normocephalic. Oropharynx is clear and moist.  Eyes: Conjunctivae and EOM are normal. PERRLA, no scleral icterus.  Neck: Normal ROM. Neck supple. No JVD. No tracheal deviation. CVS: RRR, S1/S2 +, no murmurs, no gallops, no carotid bruit.  Pulmonary: Effort and breath sounds normal, no stridor, rhonchi, wheezes, rales.  Abdominal: Soft. BS +,  no distension, tenderness, rebound or guarding.  Right lower quadrant ostomy Musculoskeletal: Normal range of motion. No  edema and no tenderness.  Neuro: Alert. CN 2-12 grossly intact. No focal deficits. Skin: Stage IV sacral decubitus ulcer, present on admission  Probes to the bone but there is no area of erythema around the decubitus ulcer that was visible to me today  psychiatric: Normal mood and affect.      LABORATORY PANEL:   CBC Recent Labs  Lab 11/25/17 0954  WBC 22.3*  HGB 9.9*  HCT 29.6*  PLT 152   ------------------------------------------------------------------------------------------------------------------  Chemistries  Recent Labs  Lab 11/25/17 0522 11/25/17 0645  NA 130*  --   K 3.7  --   CL 99*  --   CO2 23  --   GLUCOSE 90  --   BUN 29*  --   CREATININE 1.47*  --   CALCIUM 5.9*  --   MG 2.6*  --   AST  --  22  ALT  --  13*  ALKPHOS  --  80  BILITOT  --  0.6   ------------------------------------------------------------------------------------------------------------------  Cardiac Enzymes Recent Labs  Lab 11/22/17 2004 11/23/17 0226 11/23/17 0811  TROPONINI 0.11* 0.09* 0.08*   ------------------------------------------------------------------------------------------------------------------  RADIOLOGY:  11/25/17 Ekg Site Rite  Result Date: 11/24/2017 If Site Rite image not attached, placement could not be confirmed due to current cardiac rhythm.    ASSESSMENT AND PLAN:   84 year old female who is predominantly bedbound who presents from nursing home due to nausea and worsening of chronic sacral decubitus ulcer   1.  Acute on chronic infected sacral decubitus ulcer stage IV: Patient is status post debridement of chronic stage IV sacral coccygeal pressure wound Continue local wound care and repositioning a patient Dr. 88 will see patient tomorrow for long-term antibiotics.  In the past he is recommending doxycycline. Continue vancomycin  2.  Severe electrolyte abnormalities due to protein malnutrition: Electrolytes have improved   3.  Elevated  troponin: This is due to demand ischemia.  Patient has ruled out for ACS Appreciate cardiology consultation  4.  Protein calorie malnutrition: Dietary consultation initiated.  5.  Hyperlipidemia: Continue pravastatin 6.  Hyponatremia: HCTZ discontinued for now Sodium level stable  7.  Essential hypertension: Patient blood pressure medications on hold for now due to low/normal blood pressure  Follow blood pressure 8.  Diabetes: Continue sliding scale  9.  ESBL E. coli UTI: Patient will need PICC line and long-term IV antibiotics Dr. Sampson Goon will see patient tomorrow Continue Invanz Follow CBC in a.m. Management plans discussed with the patient and she is in agreement.  Palliative care consultation placed for goals of care   CODE STATUS: full  TOTAL TIME TAKING CARE OF THIS PATIENT: 24 minutes.     POSSIBLE D/C tomorrow, DEPENDING ON CLINICAL CONDITION.   Tamiya Colello M.D on 11/25/2017 at 11:58 AM  Between 7am to 6pm - Pager - [phone] After 6pm go to [email or web address] - password [email or web address]  [facility]  Office  [phone]  CC: Primary care physician; Carmin Richmond, MD  Note: This dictation was prepared with Dragon dictation along with smaller phrase technology. Any transcriptional errors that result from this process are unintentional.

## 2017-11-25 NOTE — Care Management Important Message (Signed)
Important Message  Patient Details  Name: [name] MRN: [id number] Date of Birth: April 01, 1929   Medicare Important Message Given:  Yes    Chapman Fitch, RN 11/25/2017, 1:50 PM

## 2017-11-25 NOTE — Progress Notes (Signed)
Attempted right upper arm PICC placement x3 without success. Able to assess all 3 times with good blood return, unable to thread guidewire. Previous IR [place] Guide CV line left 08/18/08 states, "Possible right central venous occlusion or stenosis. Referred to IR for central line placement.

## 2017-11-25 NOTE — Progress Notes (Signed)
Pharmacy Antibiotic Note  [name] is a 84 y.o. female admitted on 11/22/2017 with wound infection.  Pharmacy has been consulted for Vanc and zosyn dosing. 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   Plan: Vancomycin 1 gm IV X 1 given on 5/24 @ 18:00. Vancomycin 750 mg IV Q36H ordered to start on 5/25 @ 14:00, ~ 20 hrs after 1st dose (stacked dosing). Will draw 1st trough on 5/28 @ 13:30 , which will not be at [facility].   Height: 5\' 1"  (154.9 cm) Weight: 144 lb 3.2 oz (65.4 kg) IBW/kg (Calculated) : 47.8  Temp (24hrs), Avg:97.7 F (36.5 C), Min:97.5 F (36.4 C), Max:97.9 F (36.6 C)  Recent Labs  Lab 11/22/17 1658 11/22/17 1723 11/22/17 2004 11/23/17 0226 11/23/17 0808 11/23/17 2302 11/24/17 0549 11/25/17 0522  WBC 17.6*  --  16.3* 15.6*  --   --   --  20.9*  CREATININE 2.02*  --  1.94* 1.73* 1.61* 1.54* 1.52* 1.47*  LATICACIDVEN  --  1.3  --   --   --   --   --   --     Estimated Creatinine Clearance: 23.8 mL/min (A) (by C-G formula based on SCr of 1.47 mg/dL (H)).    No Known Allergies  Antimicrobials this admission:   >>    >>   Dose adjustments this admission:   Microbiology results:  BCx:   UCx:    Sputum:    MRSA PCR:   Thank you for allowing pharmacy to be a part of this patient's care.  11/27/17 11/25/2017 8:37 AM

## 2017-11-25 NOTE — Progress Notes (Signed)
Peripherally Inserted Central Catheter/Midline Placement  The IV Nurse has discussed with the patient and/or persons authorized to consent for the patient, the purpose of this procedure and the potential benefits and risks involved with this procedure.  The benefits include less needle sticks, lab draws from the catheter, and the patient may be discharged home with the catheter. Risks include, but not limited to, infection, bleeding, blood clot (thrombus formation), and puncture of an artery; nerve damage and irregular heartbeat and possibility to perform a PICC exchange if needed/ordered by physician.  Alternatives to this procedure were also discussed.  Bard Power PICC patient education guide, fact sheet on infection prevention and patient information card has been provided to patient /or left at bedside.    PICC/Midline Placement Documentation        Stacie Glaze Horton 11/25/2017, 1:48 PM

## 2017-11-25 NOTE — Consult Note (Signed)
                                                                                 Consultation Note Date: 11/25/2017   Patient Name: [name]  DOB: 07/20/1928  MRN: [id number]  Age / Sex: 84 y.o., female  PCP: Davis, James W, MD Referring Physician: Mody, Sital, MD  Reason for Consultation: Establishing goals of care  HPI/Patient Profile: [name]  is a 84 y.o. female with a known history per below, chronic stage IV sacral decubitus wound, sent from NH w/ acute emesis with nausea, increasing wound size with drainage/foul-smelling of her sacral wound,    Clinical Assessment and Goals of Care: Patient resting in bed with daughter at bedside. There are 3 children, and 2 of them are POA's if needed, Melody and Harvey. She states Harvey is present the most as he is retired. She has lived in an ALF until December when she was hospitalized and sent to a rehab facility. At the facility she was working to stand from a wheelchair. She states she developed a sore on her buttock in March. The wound is being followed by a wound doctor. The antibiotics over the past 2 weeks she has been recieving have made her nauseated and have given her a poor appetite. She states she has been bed bound for 2 weeks.    She states she is going to have a PICC placed and hopefully this will help her nausea. She states her goal is to care for herself and her ostomy as she had before.  Upon broaching advanced directives, they state she has the POA papers as well as a living will. She does not remember what is on the living will, but she states she wants everything possible for as long as possible. She states she wants to be a full code.    They are amenable to palliative to follow outpatient.      SUMMARY OF RECOMMENDATIONS   They are amenable to palliative to follow outpatient.   Code Status/Advance Care Planning:  Full code    Symptom Management:   Per primary team.   Palliative Prophylaxis:   Eye  Care and Oral Care   Prognosis:   Poor if continued poor PO intake. Sacral wound. Infection.   Discharge Planning: To Be Determined      Primary Diagnoses: Present on Admission: . Sepsis (HCC)   I have reviewed the medical record, interviewed the patient and family, and examined the patient. The following aspects are pertinent.  Past Medical History:  Diagnosis Date  . Anemia   . Diabetes mellitus without complication (HCC)    Social History   Socioeconomic History  . Marital status: Widowed    Spouse name: Not on file  . Number of children: Not on file  . Years of education: Not on file  . Highest education level: Not on file  Occupational History  . Not on file  Social Needs  . Financial resource strain: Not on file  . Food insecurity:    Worry: Not on file    Inability: Not on file  . Transportation needs:    Medical: Not on   file    Non-medical: Not on file  Tobacco Use  . Smoking status: Never Smoker  . Smokeless tobacco: Never Used  Substance and Sexual Activity  . Alcohol use: Never    Frequency: Never  . Drug use: Never  . Sexual activity: Not on file  Lifestyle  . Physical activity:    Days per week: Not on file    Minutes per session: Not on file  . Stress: Not on file  Relationships  . Social connections:    Talks on phone: Not on file    Gets together: Not on file    Attends religious service: Not on file    Active member of club or organization: Not on file    Attends meetings of clubs or organizations: Not on file    Relationship status: Not on file  Other Topics Concern  . Not on file  Social History Narrative  . Not on file   No family history on file. Scheduled Meds: . aspirin EC  325 mg Oral Daily  . calcium carbonate  1 tablet Oral BID WC  . famotidine  20 mg Oral Daily  . heparin  5,000 Units Subcutaneous Q8H  . insulin aspart  0-5 Units Subcutaneous QHS  . insulin aspart  0-9 Units Subcutaneous TID WC  . multivitamin with  minerals  1 tablet Oral Daily  . pravastatin  40 mg Oral Daily  . pregabalin  50 mg Oral TID  . protein supplement shake  11 oz Oral BID BM  . saccharomyces boulardii  250 mg Oral Daily  . vitamin C  500 mg Oral BID  . zinc sulfate  220 mg Oral Daily   Continuous Infusions: . ertapenem Stopped (11/24/17 1315)  . vancomycin Stopped (11/25/17 0222)   PRN Meds:.acetaminophen **OR** acetaminophen, albuterol, ondansetron **OR** ondansetron (ZOFRAN) IV, oxyCODONE-acetaminophen, polyethylene glycol, polyvinyl alcohol, sodium chloride Medications Prior to Admission:  Prior to Admission medications   Medication Sig Start Date End Date Taking? Authorizing Provider  acetaminophen (TYLENOL) 325 MG tablet Take 650 mg by mouth every 4 (four) hours as needed.   Yes [provider]  albuterol (PROVENTIL) (2.5 MG/3ML) 0.083% nebulizer solution Take 3 mLs by nebulization every 6 (six) hours as needed for wheezing or shortness of breath.   Yes [provider]  amLODipine (NORVASC) 10 MG tablet Take 10 mg by mouth daily.   Yes [provider]  Cyanocobalamin (B-12) 1000 MCG/ML KIT Inject 1,000 mcg as directed every 30 (thirty) days.   Yes [provider]  hydrochlorothiazide (HYDRODIURIL) 25 MG tablet Take 25 mg by mouth daily.   Yes [provider]  levofloxacin (LEVAQUIN) 500 MG tablet Take 500 mg by mouth daily. 11/20/17 11/26/17 Yes [provider]  Multiple Vitamin (MULTIVITAMIN) tablet Take 1 tablet by mouth daily.   Yes [provider]  ondansetron (ZOFRAN) 4 MG tablet Take 4 mg by mouth every 8 (eight) hours as needed for nausea or vomiting.   Yes [provider]  oxyCODONE-acetaminophen (PERCOCET/ROXICET) 5-325 MG tablet Take 1 tablet by mouth every 6 (six) hours as needed for severe pain.   Yes [provider]  pantoprazole (PROTONIX) 40 MG tablet Take 40 mg by mouth 2 (two) times daily.   Yes [provider]    pravastatin (PRAVACHOL) 40 MG tablet Take 40 mg by mouth daily.   Yes [provider]  pregabalin (LYRICA) 50 MG capsule Take 50 mg by mouth 3 (three) times daily.  Yes [provider]  saccharomyces boulardii (FLORASTOR) 250 MG capsule Take 250 mg by mouth daily. 10/29/17 11/26/17 Yes [provider]  Saline 0.65 % (Soln) SOLN Place 2 sprays into the nose every 4 (four) hours as needed (NASAL CONGESTION).   Yes [provider]  vitamin C (ASCORBIC ACID) 250 MG tablet Take 250 mg by mouth daily.   Yes [provider]  zinc sulfate 50 MG CAPS capsule Take 220 mg by mouth daily.   Yes [provider]   No Known Allergies Review of Systems  Gastrointestinal: Positive for nausea.  Skin: Positive for wound.  Neurological: Positive for weakness.    Physical Exam  Constitutional: No distress.  Pulmonary/Chest: Effort normal.  Neurological: She is alert.  Oriented    Vital Signs: BP (!) 114/51 (BP Location: Right Arm)   Pulse (!) 58   Temp 97.8 F (36.6 C) (Oral)   Resp 19   Ht 5' 1" (1.549 m)   Wt 65.4 kg (144 lb 3.2 oz)   SpO2 97%   BMI 27.25 kg/m  Pain Scale: 0-10 POSS *See Group Information*: 1-Acceptable,Awake and alert Pain Score: 0-No pain   SpO2: SpO2: 97 % O2 Device:SpO2: 97 % O2 Flow Rate: .   IO: Intake/output summary:   Intake/Output Summary (Last 24 hours) at 11/25/2017 1513 Last data filed at 11/25/2017 1347 Gross per 24 hour  Intake 305 ml  Output -  Net 305 ml    LBM: Last BM Date: 11/24/17 Baseline Weight: Weight: 68 kg (150 lb) Most recent weight: Weight: 65.4 kg (144 lb 3.2 oz)     Palliative Assessment/Data:     Time In: 2: 40 Time Out: 3:10 Time Total: 30 min Greater than 50%  of this time was spent counseling and coordinating care related to the above assessment and plan.  Signed by: Crystal Griffin, NP   Please contact Palliative Medicine Team phone at [phone] for questions and  concerns.  For individual provider: See Amion             

## 2017-11-25 NOTE — Progress Notes (Signed)
Pharmacy consulted for electrolyte replacement protocol:   Goal of therapy: Electrolytes within normal limits:  K 3.5 - 5.1 Corrected Ca 8.9 - 10.3 Phos 2.5 - 4.6 Mg 1.7 - 2.4   Assessment: Lab Results  Component Value Date   CREATININE 1.47 (H) 11/25/2017   BUN 29 (H) 11/25/2017   NA 130 (L) 11/25/2017   K 3.7 11/25/2017   CL 99 (L) 11/25/2017   CO2 23 11/25/2017    K 3.7, Mag 2.6, Ca 5.9, Alb 1.7, CorrCa 7.7, SCr 1.47 , CrCl 23.8 ml/min  Plan: Will order Ca carbonate 1 tab BIDWC x2 doses - lower dose due to CrCl <25 ml/min.  Recheck electrolytes with am labs including phos to help guide Ca supplementation.    Marty Heck, [facility] Clinical Pharmacist 11/25/2017, 8:26 AM

## 2017-11-25 NOTE — Progress Notes (Signed)
PHARMACY NOTE:  RENAL DOSAGE ADJUSTMENT  Famotidine 20 mg PO BID ordered (change from famotidine 20 mg IV q24h by pharmacy.)  Renal Function:  Estimated Creatinine Clearance: 23.8 mL/min (A) (by C-G formula based on SCr of 1.47 mg/dL (H)). []      On intermittent HD, scheduled: []      On CRRT    Dosage has been changed to:  Famotidine 20 mg PO daily    Thank you for allowing pharmacy to be a part of this patient's care.  , [facility] 11/25/2017 8:27 AM

## 2017-11-26 ENCOUNTER — Inpatient Hospital Stay: Payer: Medicare Other

## 2017-11-26 DIAGNOSIS — E440 Moderate protein-calorie malnutrition: Secondary | ICD-10-CM

## 2017-11-26 LAB — CBC
HEMATOCRIT: 29.9 % — AB (ref 35.0–47.0)
HEMOGLOBIN: 10.2 g/dL — AB (ref 12.0–16.0)
MCH: 29.6 pg (ref 26.0–34.0)
MCHC: 34.2 g/dL (ref 32.0–36.0)
MCV: 86.7 fL (ref 80.0–100.0)
Platelets: 151 10*3/uL (ref 150–440)
RBC: 3.44 MIL/uL — AB (ref 3.80–5.20)
RDW: 18.6 % — ABNORMAL HIGH (ref 11.5–14.5)
WBC: 21.4 10*3/uL — ABNORMAL HIGH (ref 3.6–11.0)

## 2017-11-26 LAB — BASIC METABOLIC PANEL
Anion gap: 8 (ref 5–15)
BUN: 32 mg/dL — AB (ref 6–20)
CHLORIDE: 101 mmol/L (ref 101–111)
CO2: 23 mmol/L (ref 22–32)
Calcium: 7.1 mg/dL — ABNORMAL LOW (ref 8.9–10.3)
Creatinine, Ser: 1.36 mg/dL — ABNORMAL HIGH (ref 0.44–1.00)
GFR calc Af Amer: 40 mL/min — ABNORMAL LOW (ref >60)
GFR calc non Af Amer: 34 mL/min — ABNORMAL LOW (ref >60)
GLUCOSE: 95 mg/dL (ref 65–99)
POTASSIUM: 3.6 mmol/L (ref 3.5–5.1)
SODIUM: 132 mmol/L — AB (ref 135–145)

## 2017-11-26 LAB — GLUCOSE, CAPILLARY
GLUCOSE-CAPILLARY: 79 mg/dL (ref 65–99)
GLUCOSE-CAPILLARY: 232 mg/dL — AB (ref 65–99)
GLUCOSE-CAPILLARY: 164 mg/dL — AB (ref 65–99)
Glucose-Capillary: 162 mg/dL — ABNORMAL HIGH (ref 65–99)

## 2017-11-26 LAB — CALCIUM, IONIZED: Calcium, Ionized, Serum: 3.6 mg/dL — ABNORMAL LOW (ref 4.5–5.6)

## 2017-11-26 LAB — TROPONIN I
TROPONIN I: 0.06 ng/mL — AB (ref <0.03)
Troponin I: 0.19 ng/mL (ref <0.03)
Troponin I: 0.03 ng/mL (ref <0.03)

## 2017-11-26 LAB — MAGNESIUM: Magnesium: 2 mg/dL (ref 1.7–2.4)

## 2017-11-26 LAB — VANCOMYCIN, TROUGH: Vancomycin Tr: 16 ug/mL (ref 15–20)

## 2017-11-26 LAB — PHOSPHORUS: PHOSPHORUS: 2.1 mg/dL — AB (ref 2.5–4.6)

## 2017-11-26 MED ORDER — AMLODIPINE BESYLATE 10 MG PO TABS
10.0000 mg | ORAL_TABLET | Freq: Every day | ORAL | Status: DC
  Administered 2017-11-26: 10 mg via ORAL
  Filled 2017-11-26: qty 1

## 2017-11-26 MED ORDER — K PHOS MONO-SOD PHOS DI & MONO 155-852-130 MG PO TABS
500.0000 mg | ORAL_TABLET | ORAL | Status: AC
  Administered 2017-11-26 (×4): 500 mg via ORAL
  Filled 2017-11-26 (×4): qty 2

## 2017-11-26 NOTE — Consult Note (Signed)
[facility] Infectious Disease     Reason for Consult: Sepsis, ESBL E coli, sacral wound.   Referring Physician: Bettey Costa  Date of Admission:  11/22/2017   Active Problems:   Sepsis ([place])   Pressure injury of skin   Malnutrition of moderate degree   HPI: [name] is a 84 y.o. female with multiple medical problems including a  history of rheumatoid arthritis as well as diabetes. She has hx of colostomy for the last 20 years as well.  She was seen by me 4/24 for Sacral decub and underlying osteo,  Now admitted with NV, worsening wound, odor from wound. On admit wbc 17 K, hypotensive. UA > 50 wbc and wbc clumps. Seen by surgery and had debridement at bedside 5/25.  BCx neg, UCX > 100 K ESBL E coli. MRSA PCR negative.  She was initially seen at the wound care center March 21 of 2019 when she had developed a sacral ulcer after hospitalization. She was hospitalized in [place] from December to January. She became progressively weaker and unable to walk. Previously she was in assisted living and able to walk with a walker however she became largely nonambulatory now uses a  Hoyer lift for positioning and spends 5 to 8 hours a day in a wheelchair. She had some prior debridement done at the wound care center and there was reports of no overt signs of infection. She then had x-ray imaging done at her facility which was concerning for osteomyelitis involving the inferior sacrum she was started on doxycycline on March 28.      Past Medical History:  Diagnosis Date  . Anemia   . Diabetes mellitus without complication [facility])    Past Surgical History:  Procedure Laterality Date  . COLOSTOMY     Social History   Tobacco Use  . Smoking status: Never Smoker  . Smokeless tobacco: Never Used  Substance Use Topics  . Alcohol use: Never    Frequency: Never  . Drug use: Never   No family history on file.  Allergies: No Known Allergies  Current antibiotics: Antibiotics Given (last 72  hours)    Date/Time Action Medication Dose Rate   11/23/17 1719 New Bag/Given   piperacillin-tazobactam (ZOSYN) IVPB 3.375 g 3.375 g 12.5 mL/hr   11/24/17 0651 New Bag/Given   piperacillin-tazobactam (ZOSYN) IVPB 3.375 g 3.375 g 12.5 mL/hr   11/24/17 1243 New Bag/Given   ertapenem (INVANZ) 500 mg in sodium chloride 0.9 % 50 mL IVPB 500 mg 100 mL/hr   11/25/17 0104 New Bag/Given   vancomycin (VANCOCIN) IVPB 750 mg/150 ml premix 750 mg 150 mL/hr   11/25/17 1537 New Bag/Given   ertapenem (INVANZ) 500 mg in sodium chloride 0.9 % 50 mL IVPB 500 mg 100 mL/hr   11/26/17 1012 New Bag/Given   ertapenem (INVANZ) 500 mg in sodium chloride 0.9 % 50 mL IVPB 500 mg 100 mL/hr   11/26/17 1352 New Bag/Given   vancomycin (VANCOCIN) IVPB 750 mg/150 ml premix 750 mg 150 mL/hr      MEDICATIONS: . amLODipine  10 mg Oral Daily  . aspirin EC  325 mg Oral Daily  . famotidine  20 mg Oral Daily  . heparin  5,000 Units Subcutaneous Q8H  . insulin aspart  0-5 Units Subcutaneous QHS  . insulin aspart  0-9 Units Subcutaneous TID WC  . multivitamin with minerals  1 tablet Oral Daily  . phosphorus  500 mg Oral Q4H  . pravastatin  40 mg Oral  Daily  . pregabalin  50 mg Oral TID  . protein supplement shake  11 oz Oral BID BM  . saccharomyces boulardii  250 mg Oral Daily  . vitamin C  500 mg Oral BID  . zinc sulfate  220 mg Oral Daily    Review of Systems - 11 systems reviewed and negative per HPI   OBJECTIVE: Temp:  [97.8 F (36.6 C)-97.9 F (36.6 C)] 97.8 F (36.6 C) (05/28 1158) Pulse Rate:  [67-72] 69 (05/28 1158) Resp:  [18-20] 20 (05/28 1158) BP: (129-165)/(59-71) 144/61 (05/28 1158) SpO2:  [96 %-97 %] 96 % (05/28 1158) Physical Exam  Constitutional:  Chronically ill appearing, sleepy, but arousable HENT: C-Road/AT, PERRLA, no scleral icterus Mouth/Throat: Oropharynx is clear and dry . No oropharyngeal exudate.  Cardiovascular: Normal rate, regular rhythm and normal heart sounds. Pulmonary/Chest:  Effort normal and breath sounds normal. No respiratory distress.  has no wheezes.  Neck = supple, no nuchal rigidity Abdominal: Soft. Bowel sounds are normal.  exhibits no distension. There is no tenderness.  Lymphadenopathy: no cervical adenopathy. No axillary adenopathy Skin - large sacral wound with clean base, some exposed palpable bone, min drainage and odor Neurological: alert and oriented to person, place, and time.  Skin: Skin is warm and dry. No rash noted. No erythema.  Psychiatric: a normal mood and affect.  behavior is normal.    LABS: Results for orders placed or performed during the hospital encounter of 11/22/17 (from the past 48 hour(s))  Glucose, capillary     Status: Abnormal   Collection Time: 11/24/17  9:22 PM  Result Value Ref Range   Glucose-Capillary 170 (H) 65 - 99 mg/dL  Basic metabolic panel     Status: Abnormal   Collection Time: 11/25/17  5:22 AM  Result Value Ref Range   Sodium 130 (L) 135 - 145 mmol/L   Potassium 3.7 3.5 - 5.1 mmol/L   Chloride 99 (L) 101 - 111 mmol/L   CO2 23 22 - 32 mmol/L   Glucose, Bld 90 65 - 99 mg/dL   BUN 29 (H) 6 - 20 mg/dL   Creatinine, Ser 1.47 (H) 0.44 - 1.00 mg/dL   Calcium 5.9 (LL) 8.9 - 10.3 mg/dL    Comment: CRITICAL RESULT CALLED TO, READ BACK BY AND VERIFIED WITH  TRISHA KING AT 0600 11/25/17 SDR    GFR calc non Af Amer 31 (L) >60 mL/min   GFR calc Af Amer 36 (L) >60 mL/min    Comment: (NOTE) The eGFR has been calculated using the CKD EPI equation. This calculation has not been validated in all clinical situations. eGFR's persistently <60 mL/min signify possible Chronic Kidney Disease.    Anion gap 8 5 - 15    Comment: Performed at [facility], [address] [place], [place] [address]  Magnesium     Status: Abnormal   Collection Time: 11/25/17  5:22 AM  Result Value Ref Range   Magnesium 2.6 (H) 1.7 - 2.4 mg/dL    Comment: Performed at [facility], [address] [place], [place]  [address]  CBC     Status: Abnormal   Collection Time: 11/25/17  5:22 AM  Result Value Ref Range   WBC 20.9 (H) 3.6 - 11.0 K/uL   RBC 3.24 (L) 3.80 - 5.20 MIL/uL   Hemoglobin 9.5 (L) 12.0 - 16.0 g/dL   HCT 28.0 (L) 35.0 - 47.0 %   MCV 86.4 80.0 - 100.0 fL   MCH 29.2 26.0 -  34.0 pg   MCHC 33.9 32.0 - 36.0 g/dL   RDW 18.6 (H) 11.5 - 14.5 %   Platelets 148 (L) 150 - 440 K/uL    Comment: Performed at [facility], [address] [place], [place] [address]  Calcium, ionized     Status: Abnormal   Collection Time: 11/25/17  6:45 AM  Result Value Ref Range   Calcium, Ionized, Serum 3.6 (L) 4.5 - 5.6 mg/dL    Comment: (NOTE) Performed At: [facility] [place], [place] [id number] Rush Farmer MD [id number] Performed at [facility], [address] [place], [place] [address]   Hepatic function panel     Status: Abnormal   Collection Time: 11/25/17  6:45 AM  Result Value Ref Range   Total Protein 5.8 (L) 6.5 - 8.1 g/dL   Albumin 1.7 (L) 3.5 - 5.0 g/dL   AST 22 15 - 41 U/L   ALT 13 (L) 14 - 54 U/L   Alkaline Phosphatase 80 38 - 126 U/L   Total Bilirubin 0.6 0.3 - 1.2 mg/dL   Bilirubin, Direct <0.1 (L) 0.1 - 0.5 mg/dL   Indirect Bilirubin NOT CALCULATED 0.3 - 0.9 mg/dL    Comment: Performed at [facility], [address] [place], [place] [address]  Glucose, capillary     Status: Abnormal   Collection Time: 11/25/17  7:35 AM  Result Value Ref Range   Glucose-Capillary 119 (H) 65 - 99 mg/dL   Comment 1 Notify RN   CBC     Status: Abnormal   Collection Time: 11/25/17  9:54 AM  Result Value Ref Range   WBC 22.3 (H) 3.6 - 11.0 K/uL   RBC 3.39 (L) 3.80 - 5.20 MIL/uL   Hemoglobin 9.9 (L) 12.0 - 16.0 g/dL   HCT 29.6 (L) 35.0 - 47.0 %   MCV 87.2 80.0 - 100.0 fL   MCH 29.2 26.0 - 34.0 pg   MCHC 33.5 32.0 - 36.0 g/dL   RDW 18.5 (H) 11.5 - 14.5 %   Platelets 152 150 - 440 K/uL    Comment: Performed at [facility], [address] [place],  [address]  Glucose, capillary     Status: Abnormal   Collection Time: 11/25/17 11:40 AM  Result Value Ref Range   Glucose-Capillary 137 (H) 65 - 99 mg/dL   Comment 1 Notify RN   Glucose, capillary     Status: Abnormal   Collection Time: 11/25/17  4:47 PM  Result Value Ref Range   Glucose-Capillary 118 (H) 65 - 99 mg/dL   Comment 1 Notify RN   Glucose, capillary     Status: Abnormal   Collection Time: 11/25/17  8:58 PM  Result Value Ref Range   Glucose-Capillary 146 (H) 65 - 99 mg/dL  Basic metabolic panel     Status: Abnormal   Collection Time: 11/26/17  6:06 AM  Result Value Ref Range   Sodium 132 (L) 135 - 145 mmol/L   Potassium 3.6 3.5 - 5.1 mmol/L   Chloride 101 101 - 111 mmol/L   CO2 23 22 - 32 mmol/L   Glucose, Bld 95 65 - 99 mg/dL   BUN 32 (H) 6 - 20 mg/dL   Creatinine, Ser 1.36 (H) 0.44 - 1.00 mg/dL   Calcium 7.1 (L) 8.9 - 10.3 mg/dL   GFR calc non Af Amer 34 (L) >60 mL/min   GFR calc Af Amer 40 (L) >60 mL/min    Comment: (  NOTE) The eGFR has been calculated using the CKD EPI equation. This calculation has not been validated in all clinical situations. eGFR's persistently <60 mL/min signify possible Chronic Kidney Disease.    Anion gap 8 5 - 15    Comment: Performed at [facility], [address] [place], [place] [address]  Phosphorus     Status: Abnormal   Collection Time: 11/26/17  6:06 AM  Result Value Ref Range   Phosphorus 2.1 (L) 2.5 - 4.6 mg/dL    Comment: Performed at [facility], [address] [place], [place] [address]  Magnesium     Status: None   Collection Time: 11/26/17  6:06 AM  Result Value Ref Range   Magnesium 2.0 1.7 - 2.4 mg/dL    Comment: Performed at [facility], [address] [place], [place] [address]  CBC     Status: Abnormal   Collection Time: 11/26/17  6:06 AM  Result Value Ref Range   WBC 21.4 (H) 3.6 - 11.0 K/uL   RBC 3.44 (L) 3.80 - 5.20 MIL/uL   Hemoglobin 10.2  (L) 12.0 - 16.0 g/dL   HCT 29.9 (L) 35.0 - 47.0 %   MCV 86.7 80.0 - 100.0 fL   MCH 29.6 26.0 - 34.0 pg   MCHC 34.2 32.0 - 36.0 g/dL   RDW 18.6 (H) 11.5 - 14.5 %   Platelets 151 150 - 440 K/uL    Comment: Performed at [facility], [address] [place], [place] [address]  Glucose, capillary     Status: None   Collection Time: 11/26/17  7:26 AM  Result Value Ref Range   Glucose-Capillary 79 65 - 99 mg/dL  Troponin I     Status: Abnormal   Collection Time: 11/26/17 11:23 AM  Result Value Ref Range   Troponin I 0.06 (HH) <0.03 ng/mL    Comment: CRITICAL VALUE NOTED. VALUE IS CONSISTENT WITH PREVIOUSLY REPORTED/CALLED VALUE  SDR Performed at [facility], [address] [place], [place] [address]   Glucose, capillary     Status: Abnormal   Collection Time: 11/26/17 11:44 AM  Result Value Ref Range   Glucose-Capillary 162 (H) 65 - 99 mg/dL  Vancomycin, trough     Status: None   Collection Time: 11/26/17  1:24 PM  Result Value Ref Range   Vancomycin Tr 16 15 - 20 ug/mL    Comment: Performed at [facility], [address] [place], [place] [address]  Glucose, capillary     Status: Abnormal   Collection Time: 11/26/17  4:39 PM  Result Value Ref Range   Glucose-Capillary 232 (H) 65 - 99 mg/dL   No components found for: ESR, C REACTIVE PROTEIN MICRO: Recent Results (from the past 720 hour(s))  Culture, blood (Routine X 2) w Reflex to ID Panel     Status: None (Preliminary result)   Collection Time: 11/22/17  4:52 PM  Result Value Ref Range Status   Specimen Description BLOOD LEFT ANTECUBITAL  Final   Special Requests   Final    BOTTLES DRAWN AEROBIC AND ANAEROBIC Blood Culture adequate volume   Culture   Final    NO GROWTH 4 DAYS Performed at [facility], [address] [place], [place] [address]    Report Status PENDING  Incomplete  Culture, blood (Routine X 2) w Reflex to ID Panel     Status: None (Preliminary result)   Collection  Time: 11/22/17  4:58 PM  Result Value  Ref Range Status   Specimen Description BLOOD BLOOD RIGHT HAND  Final   Special Requests   Final    BOTTLES DRAWN AEROBIC AND ANAEROBIC Blood Culture results may not be optimal due to an inadequate volume of blood received in culture bottles   Culture   Final    NO GROWTH 4 DAYS Performed at [facility], [address] [place], [place] [address]    Report Status PENDING  Incomplete  Urine culture     Status: Abnormal   Collection Time: 11/22/17  6:02 PM  Result Value Ref Range Status   Specimen Description   Final    URINE, CATHETERIZED Performed at [facility], [address] [place], [place] [address]    Special Requests   Final    Normal Performed at [facility], [address] [place], [place] [address]    Culture (A)  Final    >=100,000 COLONIES/mL ESCHERICHIA COLI Confirmed Extended Spectrum Beta-Lactamase Producer (ESBL).  In bloodstream infections from ESBL organisms, carbapenems are preferred over piperacillin/tazobactam. They are shown to have a lower risk of mortality. Performed at [facility], [place] [address], [place], [place] [address]    Report Status 11/24/2017 FINAL  Final   Organism ID, Bacteria ESCHERICHIA COLI (A)  Final      Susceptibility   Escherichia coli - MIC*    AMPICILLIN >=32 RESISTANT Resistant     CEFAZOLIN >=64 RESISTANT Resistant     CEFTRIAXONE >=64 RESISTANT Resistant     CIPROFLOXACIN >=4 RESISTANT Resistant     GENTAMICIN <=1 SENSITIVE Sensitive     IMIPENEM <=0.25 SENSITIVE Sensitive     NITROFURANTOIN 64 INTERMEDIATE Intermediate     TRIMETH/SULFA >=320 RESISTANT Resistant     AMPICILLIN/SULBACTAM >=32 RESISTANT Resistant     PIP/TAZO <=4 SENSITIVE Sensitive     Extended ESBL POSITIVE Resistant     * >=100,000 COLONIES/mL ESCHERICHIA COLI  MRSA PCR Screening     Status: None   Collection Time: 11/22/17  8:58 PM  Result Value Ref Range Status   MRSA by PCR  NEGATIVE NEGATIVE Final    Comment:        The GeneXpert MRSA Assay (FDA approved for NASAL specimens only), is one component of a comprehensive MRSA colonization surveillance program. It is not intended to diagnose MRSA infection nor to guide or monitor treatment for MRSA infections. Performed at [facility], [address] [place], [place] [address]     IMAGING: Dg Chest 1 View  Result Date: 11/26/2017 CLINICAL DATA:  Chest pain. EXAM: CHEST  1 VIEW COMPARISON:  11/22/2017. FINDINGS: Cardiomegaly with pulmonary vascular prominence. Left base infiltrate/edema. Small left pleural effusion. No pneumothorax. No acute bony abnormality. IMPRESSION: 1. Left base infiltrate consistent with asymmetric edema and/or pneumonia. Small left pleural effusion. 2. Cardiomegaly with pulmonary vascular prominence. A component of CHF may be present. Electronically Signed   By: Marcello Moores  Register   On: 11/26/2017 12:51   Dg Chest Port 1 View  Result Date: 11/22/2017 CLINICAL DATA:  Sepsis EXAM: PORTABLE CHEST 1 VIEW COMPARISON:  11/22/2017, 09/05/2009 FINDINGS: Mild diffuse interstitial opacity likely chronic change. No focal consolidation or effusion. Mild cardiomegaly. Aortic atherosclerosis. No pneumothorax. Surgical clips in the left upper quadrant. IMPRESSION: No active disease.  Cardiomegaly Electronically Signed   By: Donavan Foil M.D.   On: 11/22/2017 20:08   Dg Chest Portable 1 View  Result Date: 11/22/2017 CLINICAL DATA:  84 y/o F; stage IV  sacral wound, vomiting, sepsis evaluation. EXAM: PORTABLE CHEST 1 VIEW COMPARISON:  09/05/2009 chest radiograph. FINDINGS: Stable mild cardiomegaly. Calcific aortic atherosclerosis. No focal consolidation, effusion, or pneumothorax identified. No acute osseous abnormality is evident. IMPRESSION: No active disease.  Stable cardiomegaly and aortic atherosclerosis. Electronically Signed   By: Kristine Garbe M.D.   On: 11/22/2017 18:41   Dg Abd  2 Views  Result Date: 11/22/2017 CLINICAL DATA:  84 y/o  F; nausea and sacral wound. EXAM: ABDOMEN - 2 VIEW COMPARISON:  None. FINDINGS: Left hip hemiarthroplasty without periprosthetic lucency or fracture. Vascular calcifications. Nonobstructive bowel gas pattern. Surgical changes in the left upper and right lower quadrants. Advanced degenerative changes of the lumbar spine. IMPRESSION: Nonobstructive bowel gas pattern. Electronically Signed   By: Kristine Garbe M.D.   On: 11/22/2017 18:11   [place] Ekg Site Rite  Result Date: 11/24/2017 If Site Rite image not attached, placement could not be confirmed due to current cardiac rhythm.   Assessment:   FUMIKO CHAM is a 84 y.o. female with a hx of prgoressive decline over last 6 months now with UTI and  sacral decubitus ulcer with exposed bone and underlying osteomyelitis.  She does have issues with mobility, uses of Hoyer lift and does sit in a wheelchair significant portion of the day. She has a history of diabetes. She has been on oral doxy as an otpt. CUrrently has ESBL E coli UTI. Wound has been debrided at bedside by surgery and appear relatively clean. No bone cxs done.   Recommendations Will need 2 week course of IV ertapenem for the ESBL E Coli UTI. Check wound cx, ESR and CRP. She should continue to follow with  the wound care center  Thank you very much for allowing me to participate in the care of this patient. Please call with questions.   Cheral Marker. Ola Spurr, MD

## 2017-11-26 NOTE — Progress Notes (Signed)
On review of chart this evening this RN noted troponin markedly elevated compared to previous troponin this morning.  MD on call notified and orders received for cardiology consult and echocardiogram.

## 2017-11-26 NOTE — Progress Notes (Addendum)
Pharmacy consulted for electrolyte replacement protocol:   Goal of therapy: Electrolytes within normal limits:  K 3.5 - 5.1 Corrected Ca 8.9 - 10.3 Phos 2.5 - 4.6 Mg 1.7 - 2.4   Assessment: Lab Results  Component Value Date   CREATININE 1.36 (H) 11/26/2017   BUN 32 (H) 11/26/2017   NA 132 (L) 11/26/2017   K 3.6 11/26/2017   CL 101 11/26/2017   CO2 23 11/26/2017  Phos 2.1   Plan: Phos 2.1, will replace with potassium phosphate 2 tab q4h x 4 doses. Will recheck electrolytes with AM labs per protocol.     Luan Pulling, PharmD, MBA, [employer] Clinical Pharmacist [facility]

## 2017-11-26 NOTE — Progress Notes (Signed)
[facility] [place] [facility]   PATIENT NAME: [name]    MR#:  [id number]  DATE OF BIRTH:  Nov 03, 1928  SUBJECTIVE:   [name] complaining of pain all over especially in her chest.  She has chest pain upon palpitations.  She had chest pain yesterday which was relieved with Percocet.  REVIEW OF SYSTEMS:    Review of Systems  Constitutional: Negative for fever, chills weight loss HENT: Negative for ear pain, nosebleeds, congestion, facial swelling, rhinorrhea, neck pain, neck stiffness and ear discharge.   Respiratory: Negative for cough, shortness of breath, wheezing  Cardiovascular: Positive for skill skeletal chest pain, no palpitations and leg swelling.  Gastrointestinal: Negative for heartburn, abdominal pain, vomiting, diarrhea or consitpation Nausea improving Genitourinary: Negative for dysuria, urgency, frequency, hematuria Musculoskeletal: Negative for back pain or joint pain Neurological: Negative for dizziness, seizures, syncope, focal weakness,  numbness and headaches.  Hematological: Does not bruise/bleed easily.  Psychiatric/Behavioral: Negative for hallucinations, confusion, dysphoric mood Skin: Chronic stage IV sacral decubitus wound   Tolerating Diet: yes      DRUG ALLERGIES:  No Known Allergies  VITALS:  Blood pressure (!) 165/71, pulse 72, temperature 97.9 F (36.6 C), temperature source Oral, resp. rate 20, height 5\' 1"  (1.549 m), weight 65.4 kg (144 lb 3.2 oz), SpO2 97 %.  PHYSICAL EXAMINATION:  Constitutional: Appears well-developed and well-nourished. No distress. HENT: Normocephalic. Oropharynx is clear and moist.  Eyes: Conjunctivae and EOM are normal. PERRLA, no scleral icterus.  Neck: Normal ROM. Neck supple. No JVD. No tracheal deviation. CVS: RRR, S1/S2 +, no murmurs, no gallops, no carotid bruit.  Reproducible chest pain Pulmonary: Effort and breath sounds normal, no stridor, rhonchi, wheezes, rales.  Abdominal:  Soft. BS +,  no distension, tenderness, rebound or guarding.  Right lower quadrant ostomy Musculoskeletal: Normal range of motion. No edema and no tenderness.  Neuro: Alert. CN 2-12 grossly intact. No focal deficits. Skin: Stage IV sacral decubitus ulcer, present on admission  Probes to the bone but there is no area of erythema around the decubitus ulcer that was visible to me today  psychiatric: Normal mood and affect.      LABORATORY PANEL:   CBC Recent Labs  Lab 11/26/17 0606  WBC 21.4*  HGB 10.2*  HCT 29.9*  PLT 151   ------------------------------------------------------------------------------------------------------------------  Chemistries  Recent Labs  Lab 11/25/17 0645 11/26/17 0606  NA  --  132*  K  --  3.6  CL  --  101  CO2  --  23  GLUCOSE  --  95  BUN  --  32*  CREATININE  --  1.36*  CALCIUM  --  7.1*  MG  --  2.0  AST 22  --   ALT 13*  --   ALKPHOS 80  --   BILITOT 0.6  --    ------------------------------------------------------------------------------------------------------------------  Cardiac Enzymes Recent Labs  Lab 11/22/17 2004 11/23/17 0226 11/23/17 0811  TROPONINI 0.11* 0.09* 0.08*   ------------------------------------------------------------------------------------------------------------------  RADIOLOGY:  No results found.   ASSESSMENT AND PLAN:   84 year old female who is predominantly bedbound who presents from nursing home due to nausea and worsening of chronic sacral decubitus ulcer   1.  Acute on chronic infected sacral decubitus ulcer stage IV: Patient is status post debridement of chronic stage IV sacral coccygeal pressure wound Continue local wound care and repositioning a patient Dr. 88 will see patient today for long-term antibiotics.  In the past he has recommended doxycycline. Continue  vancomycin for now.  2.  Severe electrolyte abnormalities due to protein E malnutrition: Electrolytes have improved    3.  Elevated troponin on admission: This is due to demand ischemia.  Patient has ruled out for ACS Appreciate cardiology consultation  4.  Protein calorie malnutrition: Continue dietary supplements  5.  Hyperlipidemia: Continue pravastatin 6.  Hyponatremia: HCTZ discontinued for now Sodium level stable  7.  Essential hypertension: Restart Norvasc   8.  Diabetes: Continue sliding scale  9.  ESBL E. coli UTI: Patient will need PICC line and long-term IV antibiotics Dr. Sampson Goon will see patient tomorrow Continue [facility] surgery to please pick as patient was unable to have PEG placed by [facility] team and IR unable to place PEG today   10.  Atypical chest pain which is reproducible in nature Order troponins and chest x-ray  Management plans discussed with the patient and she is in agreement.  Palliative care consultation initiated and she will have outpatient palliative care upon discharge.  CODE STATUS: full  TOTAL TIME TAKING CARE OF THIS PATIENT: 24 minutes.     POSSIBLE D/C tomorrow, DEPENDING ON CLINICAL CONDITION.   [name] M.D on 11/26/2017 at 11:06 AM  Between 7am to 6pm - Pager - [phone] After 6pm go to [email or web address] - password [email or web address]  [facility]  Office  [phone]  CC: Primary care physician; Carmin Richmond, MD  Note: This dictation was prepared with Dragon dictation along with smaller phrase technology. Any transcriptional errors that result from this process are unintentional.

## 2017-11-26 NOTE — Clinical Social Work Note (Signed)
CSW has spoken with [name] at [facility] and she is now aware of patient needing to continue IV ABX at discharge. CSW has made her aware that discharge may be tomorrow. York Spaniel MSW,LcSW [phone]

## 2017-11-26 NOTE — Consult Note (Signed)
WOC Nurse wound follow up At the time of my assessment today in room 219A, I find wound care, ostomy care, supportive measures implemented as outlined in [place] [facility]'s orders and note from 11/23/17.  Please continue these measures. Monitor the wound area(s) for worsening of condition such as: Signs/symptoms of infection,  Increase in size,  Development of or worsening of odor, Development of pain, or increased pain at the affected locations.  Notify the medical team if any of these develop.  Thank you for the consult.  Discussed plan of care with the patient and bedside nurse.  WOC nurse will not follow at this time.  Please re-consult the WOC team if needed.  Helmut Muster, RN, MSN, CWOCN, CNS-BC, pager [phone]

## 2017-11-27 ENCOUNTER — Inpatient Hospital Stay (HOSPITAL_COMMUNITY)
Admit: 2017-11-27 | Discharge: 2017-11-27 | Disposition: A | Discharge status: Home / Self Care | Payer: Medicare Other | Attending: Internal Medicine | Admitting: Internal Medicine

## 2017-11-27 ENCOUNTER — Ambulatory Visit: Payer: Medicare Other | Admitting: Internal Medicine

## 2017-11-27 ENCOUNTER — Inpatient Hospital Stay: Payer: Medicare Other

## 2017-11-27 ENCOUNTER — Encounter: Payer: Self-pay | Admitting: Interventional Radiology

## 2017-11-27 DIAGNOSIS — R9431 Abnormal electrocardiogram [ECG] [EKG]: Secondary | ICD-10-CM

## 2017-11-27 HISTORY — PX: IR US GUIDE VASC ACCESS LEFT: IMG2389

## 2017-11-27 LAB — CBC
HEMATOCRIT: 30.7 % — AB (ref 35.0–47.0)
HEMOGLOBIN: 10.3 g/dL — AB (ref 12.0–16.0)
MCH: 29.4 pg (ref 26.0–34.0)
MCHC: 33.7 g/dL (ref 32.0–36.0)
MCV: 87.2 fL (ref 80.0–100.0)
Platelets: 155 10*3/uL (ref 150–440)
RBC: 3.51 MIL/uL — AB (ref 3.80–5.20)
RDW: 18.5 % — ABNORMAL HIGH (ref 11.5–14.5)
WBC: 25.2 10*3/uL — AB (ref 3.6–11.0)

## 2017-11-27 LAB — BASIC METABOLIC PANEL
ANION GAP: 10 (ref 5–15)
BUN: 39 mg/dL — ABNORMAL HIGH (ref 6–20)
CHLORIDE: 102 mmol/L (ref 101–111)
CO2: 23 mmol/L (ref 22–32)
Calcium: 7.9 mg/dL — ABNORMAL LOW (ref 8.9–10.3)
Creatinine, Ser: 1.36 mg/dL — ABNORMAL HIGH (ref 0.44–1.00)
GFR calc non Af Amer: 34 mL/min — ABNORMAL LOW (ref >60)
GFR, EST AFRICAN AMERICAN: 40 mL/min — AB (ref >60)
Glucose, Bld: 121 mg/dL — ABNORMAL HIGH (ref 65–99)
POTASSIUM: 3.3 mmol/L — AB (ref 3.5–5.1)
Sodium: 135 mmol/L (ref 135–145)

## 2017-11-27 LAB — CULTURE, BLOOD (ROUTINE X 2)
CULTURE: NO GROWTH
Culture: NO GROWTH
Special Requests: ADEQUATE

## 2017-11-27 LAB — SEDIMENTATION RATE: Sed Rate: 107 mm/hr — ABNORMAL HIGH (ref 0–30)

## 2017-11-27 LAB — ECHOCARDIOGRAM COMPLETE
HEIGHTINCHES: 61 in
WEIGHTICAEL: 2307.2 [oz_av]

## 2017-11-27 LAB — C-REACTIVE PROTEIN: CRP: 19.9 mg/dL — AB (ref <1.0)

## 2017-11-27 LAB — GLUCOSE, CAPILLARY
GLUCOSE-CAPILLARY: 110 mg/dL — AB (ref 65–99)
GLUCOSE-CAPILLARY: 125 mg/dL — AB (ref 65–99)

## 2017-11-27 MED ORDER — PREMIER PROTEIN SHAKE: 11.0000 [oz_av] | Freq: Two times a day (BID) | ORAL | 1 refills | Status: AC

## 2017-11-27 MED ORDER — POTASSIUM CHLORIDE 10 MEQ/100ML IV SOLN
10.0000 meq | INTRAVENOUS | Status: AC
  Administered 2017-11-27: 10 meq via INTRAVENOUS
  Filled 2017-11-27: qty 100

## 2017-11-27 MED ORDER — HEPARIN SOD (PORK) LOCK FLUSH 100 UNIT/ML IV SOLN
INTRAVENOUS | Status: AC
  Filled 2017-11-27: qty 5

## 2017-11-27 MED ORDER — SODIUM CHLORIDE 0.9 % IV SOLN: 500.0000 mg | INTRAVENOUS | 0 refills | Status: AC

## 2017-11-27 MED ORDER — SACCHAROMYCES BOULARDII 250 MG PO CAPS: 250.0000 mg | ORAL_CAPSULE | Freq: Every day | ORAL | 0 refills | Status: AC

## 2017-11-27 NOTE — Progress Notes (Signed)
Will await echo.

## 2017-11-27 NOTE — Procedures (Signed)
Sacral ulcer, osteomyelitis  S/p LUE SL POWER PICC  Tip svcra No comp Stable ebl min Ready for use Full report in pacs

## 2017-11-27 NOTE — Progress Notes (Addendum)
[name], [name] ([id number]) Visit Report for 11/08/2017 Arrival Information Details Patient Name: [name], [name]. Date of Service: 11/08/2017 2:15 PM Medical Record Number: [id number] Patient Account Number: [id number] Date of Birth/Sex: Nov 21, 1928 (84 y.o. F) Treating RN: [name] Primary Care [name]: [name] Other Clinician: Referring [name]: [name] Treating [name]/Extender: [name], [name] in Treatment: 7 Visit Information History Since Last Visit Added or deleted any medications: No Patient Arrived: Wheel Chair Any new allergies or adverse reactions: No Arrival Time: 14:45 Had a fall or experienced change in No Accompanied By: self activities of daily living that may affect Transfer Assistance: [name] Lift risk of falls: Patient Identification Verified: Yes Signs or symptoms of abuse/neglect since last visito No Secondary Verification Process Completed: Yes Hospitalized since last visit: No Patient Has Alerts: Yes Has Dressing in Place as Prescribed: Yes Patient Alerts: DMII Pain Present Now: No NO LIDOCAINE Electronic Signature(s) Signed: 11/27/2017 7:45:54 AM By: [name] Entered By: [name] on 11/08/2017 14:58:35 [name], [name] ([id number]) -------------------------------------------------------------------------------- Clinic Level of Care Assessment Details Patient Name: [name]. Date of Service: 11/08/2017 2:15 PM Medical Record Number: [id number] Patient Account Number: [id number] Date of Birth/Sex: 1929-05-16 (84 y.o. F) Treating RN: [name] Primary Care [name]: [name] Other Clinician: Referring [name]: [name] Treating [name]/Extender: [name], [name] in Treatment: 7 Clinic Level of Care Assessment Items TOOL 4 Quantity Score []  - Use when only an EandM is performed on FOLLOW-UP visit 0 ASSESSMENTS - Nursing Assessment / Reassessment X - Reassessment of Co-morbidities (includes  updates in patient status) 1 10 X- 1 5 Reassessment of Adherence to Treatment Plan ASSESSMENTS - Wound and Skin Assessment / Reassessment []  - Simple Wound Assessment / Reassessment - one wound 0 []  - 0 Complex Wound Assessment / Reassessment - multiple wounds []  - 0 Dermatologic / Skin Assessment (not related to wound area) ASSESSMENTS - Focused Assessment []  - Circumferential Edema Measurements - multi extremities 0 []  - 0 Nutritional Assessment / Counseling / Intervention []  - 0 Lower Extremity Assessment (monofilament, tuning fork, pulses) []  - 0 Peripheral Arterial Disease Assessment (using hand held doppler) ASSESSMENTS - Ostomy and/or Continence Assessment and Care []  - Incontinence Assessment and Management 0 []  - 0 Ostomy Care Assessment and Management (repouching, etc.) PROCESS - Coordination of Care X - Simple Patient / Family Education for ongoing care 1 15 []  - 0 Complex (extensive) Patient / Family Education for ongoing care []  - 0 Staff obtains , Records, Test Results / Process Orders []  - 0 Staff telephones HHA, Nursing Homes / Clarify orders / etc []  - 0 Routine Transfer to another Facility (non-emergent condition) []  - 0 Routine Hospital Admission (non-emergent condition) []  - 0 New Admissions / / Ordering NPWT, Apligraf, etc. []  - 0 Emergency Hospital Admission (emergent condition) X- 1 10 Simple Discharge Coordination [name]. ( ) []  - 0 Complex (extensive) Discharge Coordination PROCESS - Special Needs []  - Pediatric / Minor Patient Management 0 []  - 0 Isolation Patient Management []  - 0 Hearing / Language / Visual special needs []  - 0 Assessment of Community assistance (transportation, D/C planning, etc.) []  - 0 Additional assistance / Altered mentation []  - 0 Support Surface(s) Assessment (bed, cushion, seat, etc.) INTERVENTIONS - Wound Cleansing / Measurement X - Simple Wound Cleansing -  one wound 1 5 []  - 0 Complex Wound Cleansing - multiple wounds X- 1 5 Wound Imaging (photographs - any number of wounds) []  - 0  Wound Tracing (instead of photographs) X- 1 5 Simple Wound Measurement - one wound []  - 0 Complex Wound Measurement - multiple wounds INTERVENTIONS - Wound Dressings X - Small Wound Dressing one or multiple wounds 1 10 []  - 0 Medium Wound Dressing one or multiple wounds []  - 0 Large Wound Dressing one or multiple wounds []  - 0 Application of Medications - topical []  - 0 Application of Medications - injection INTERVENTIONS - Miscellaneous []  - External ear exam 0 []  - 0 Specimen Collection (cultures, biopsies, blood, body fluids, etc.) []  - 0 Specimen(s) / Culture(s) sent or taken to Lab for analysis X- 1 10 Patient Transfer (multiple staff / Nurse, adult / Similar devices) []  - 0 Simple Staple / Suture removal (25 or less) []  - 0 Complex Staple / Suture removal (26 or more) []  - 0 Hypo / Hyperglycemic Management (close monitor of Blood Glucose) []  - 0 Ankle / Brachial Index (ABI) - do not check if billed separately X- 1 5 Vital Signs [name], [name] J. ([id number]) Has the patient been seen at the hospital within the last three years: Yes Total Score: 80 Level Of Care: New/Established - Level 3 Electronic Signature(s) Signed: 11/08/2017 4:42:41 PM By: [name] Entered By: [name] on 11/08/2017 15:40:13 [name] ([id number]) -------------------------------------------------------------------------------- Encounter Discharge Information Details Patient Name: [name], [name]. Date of Service: 11/08/2017 2:15 PM Medical Record Number: [id number] Patient Account Number: [id number] Date of Birth/Sex: [birth year] (84 y.o. F) Treating RN: [name] Primary Care [name]: [name] Other Clinician: Referring Alante Tolan: [name] Treating Nakeeta Sebastiani/Extender: [name], [name] in Treatment: 7 Encounter Discharge  Information Items Discharge Condition: Stable Ambulatory Status: Wheelchair Discharge Destination: Home Transportation: Other Accompanied By: self Schedule Follow-up Appointment: Yes Clinical Summary of Care: Electronic Signature(s) Signed: 11/08/2017 4:42:41 PM By: [name] Entered By: [name] on 11/08/2017 15:53:04 Fletchall, [name] ([id number]) -------------------------------------------------------------------------------- Lower Extremity Assessment Details Patient Name: [name], [name]. Date of Service: 11/08/2017 2:15 PM Medical Record Number: [id number] Patient Account Number: [id number] Date of Birth/Sex: [birth year] (84 y.o. F) Treating RN: [name] Primary Care Merrel Crabbe: [name] Other Clinician: Referring Skylur Fuston: [name] Treating Erminia Mcnew/Extender: [name], [name] in Treatment: 7 Electronic Signature(s) Signed: 11/08/2017 4:42:41 PM By: [name] Signed: 11/27/2017 7:45:54 AM By: [name] Entered By: [name] on 11/08/2017 15:12:22 [name] ([id number]) -------------------------------------------------------------------------------- Multi Wound Chart Details Patient Name: [name], [name]. Date of Service: 11/08/2017 2:15 PM Medical Record Number: [id number] Patient Account Number: [id number] Date of Birth/Sex: April 17, 1929 (84 y.o. F) Treating RN: [name] Primary Care Prestin Munch: [name] Other Clinician: Referring Lacharles Altschuler: [name] Treating Nimai Burbach/Extender: STONE III, [name] in Treatment: 7 Vital Signs Height(in): 61 Pulse(bpm): 73 Weight(lbs): Blood Pressure(mmHg): 109/60 Body Mass Index(BMI): Temperature(F): 98.3 Respiratory Rate 18 (breaths/min): Photos: [1:No Photos] [N/A:N/A] Wound Location: [1:Sacrum] [N/A:N/A] Wounding Event: [1:Pressure Injury] [N/A:N/A] Primary Etiology: [1:Pressure Ulcer] [N/A:N/A] Comorbid History: [1:Cataracts, Anemia, Hypertension, Type II Diabetes,  Rheumatoid Arthritis, Osteoarthritis, Received Chemotherapy, Received Radiation] [N/A:N/A] Date Acquired: [1:07/08/2017] [N/A:N/A] [name] of Treatment: [1:7] [N/A:N/A] Wound Status: [1:Open] [N/A:N/A] Measurements L x W x D [1:4x5x1.2] [N/A:N/A] (cm) Area (cm) : [1:15.708] [N/A:N/A] Volume (cm) : [1:18.85] [N/A:N/A] % Reduction in Area: [1:-17.00%] [N/A:N/A] % Reduction in Volume: [1:-0.30%] [N/A:N/A] Starting Position 1 [1:1] (o'clock): Ending Position 1 [1:6] (o'clock): Maximum Distance 1 (cm): [1:1.9] Undermining: [1:Yes] [N/A:N/A] Classification: [1:Category/Stage IV] [N/A:N/A] Exudate Amount: [1:Large] [N/A:N/A] Exudate Type: [1:Serosanguineous] [N/A:N/A] Exudate Color: [1:red, brown] [N/A:N/A] Wound Margin: [1:Flat and Intact] [N/A:N/A]  Granulation Amount: [1:Large (67-100%)] [N/A:N/A] Granulation Quality: [1:Pink] [N/A:N/A] Necrotic Amount: [1:Small (1-33%)] [N/A:N/A] Exposed Structures: [1:Fat Layer (Subcutaneous Tissue) Exposed: Yes Bone: Yes Fascia: No] [N/A:N/A] Tendon: No Muscle: No Joint: No Epithelialization: None N/A N/A Periwound Skin Texture: Scarring: Yes N/A N/A Excoriation: No Induration: No Callus: No Crepitus: No Rash: No Periwound Skin Moisture: Maceration: No N/A N/A Dry/Scaly: No Periwound Skin Color: Atrophie Blanche: No N/A N/A Cyanosis: No Ecchymosis: No Erythema: No Hemosiderin Staining: No Mottled: No Pallor: No Rubor: No Temperature: No Abnormality N/A N/A Tenderness on Palpation: Yes N/A N/A Wound Preparation: Ulcer Cleansing: N/A N/A Rinsed/Irrigated with Saline Topical Anesthetic Applied: None, Other: hurricaine spray Treatment Notes Electronic Signature(s) Signed: 11/08/2017 4:42:41 PM By: [name] Entered By: [name] on 11/08/2017 15:32:20 [name] ([id number]) -------------------------------------------------------------------------------- Multi-Disciplinary Care Plan Details Patient Name: [name], [name]. Date of Service: 11/08/2017 2:15 PM Medical Record Number: [id number] Patient Account Number: [id number] Date of Birth/Sex: April 11, 1929 (84 y.o. F) Treating RN: [name] Primary Care Perris Conwell: [name] Other Clinician: Referring Junell Cullifer: [name] Treating Taytum Wheller/Extender: [name], [name] in Treatment: 7 Active Inactive Electronic Signature(s) Signed: 12/13/2017 8:31:32 AM By: Elliot Gurney, BSN, RN, CWS, Kim RN, BSN Signed: 2017/12/27 10:40:13 AM By: [name] Previous Signature: 11/08/2017 4:42:41 PM Version By: [name] Entered By: Elliot Gurney BSN, RN, CWS, Kim on 12/13/2017 08:31:32 [name] ([id number]) -------------------------------------------------------------------------------- Pain Assessment Details Patient Name: [name], [name]. Date of Service: 11/08/2017 2:15 PM Medical Record Number: [id number] Patient Account Number: [id number] Date of Birth/Sex: 26-Apr-1929 (84 y.o. F) Treating RN: [name] Primary Care Denna Fryberger: [name] Other Clinician: Referring Shreyas Piatkowski: [name] Treating Tereso Unangst/Extender: [name], [name] in Treatment: 7 Active Problems Location of Pain Severity and Description of Pain Patient Has Paino No Site Locations Pain Management and Medication Current Pain Management: Electronic Signature(s) Signed: 11/08/2017 4:42:41 PM By: [name] Signed: 11/27/2017 7:45:54 AM By: [name] Entered By: [name] on 11/08/2017 14:58:44 [name] ([id number]) -------------------------------------------------------------------------------- Patient/Caregiver Education Details Patient Name: [name], [name]. Date of Service: 11/08/2017 2:15 PM Medical Record Number: [id number] Patient Account Number: [id number] Date of Birth/Gender: 04/29/1929 (84 y.o. F) Treating RN: [name] Primary Care Physician: [name] Other Clinician: Referring Physician: Lois [name] Treating  Physician/Extender: Skeet Simmer in Treatment: 7 Education Assessment Education Provided To: Patient Education Topics Provided Wound/Skin Impairment: Handouts: Caring for Your Ulcer Methods: Explain/Verbal Responses: State content correctly Electronic Signature(s) Signed: 11/08/2017 4:42:41 PM By: [name] Entered By: [name] on 11/08/2017 15:53:19 Gates, [name] ([id number]) -------------------------------------------------------------------------------- Wound Assessment Details Patient Name: [name]. Date of Service: 11/08/2017 2:15 PM Medical Record Number: [id number] Patient Account Number: [id number] Date of Birth/Sex: 03-26-1929 (84 y.o. F) Treating RN: [name] Primary Care Dontavion Noxon: [name] Other Clinician: Referring Erwin Nishiyama: [name] Treating Lulla Linville/Extender: [name], [name] in Treatment: 7 Wound Status Wound Number: 1 Primary Pressure Ulcer Etiology: Wound Location: Sacrum Wound Open Wounding Event: Pressure Injury Status: Date Acquired: 07/08/2017 Comorbid Cataracts, Anemia, Hypertension, Type II [name] Of Treatment: 7 History: Diabetes, Rheumatoid Arthritis, Osteoarthritis, Clustered Wound: No Received Chemotherapy, Received Radiation Photos Photo Uploaded By: [name] on 11/08/2017 16:12:47 Wound Measurements Length: (cm) 4 Width: (cm) 5 Depth: (cm) 1.2 Area: (cm) 15.708 Volume: (cm) 18.85 % Reduction in Area: -17% % Reduction in Volume: -0.3% Epithelialization: None Undermining: Yes Starting Position (o'clock): 1 Ending Position (o'clock): 6 Maximum Distance: (cm) 1.9 Wound Description Classification: Category/Stage IV Wound Margin: Flat and Intact Exudate Amount:  Large Exudate Type: Serosanguineous Exudate Color: red, brown Foul Odor After Cleansing: No Slough/Fibrino No Wound Bed Granulation Amount: Large (67-100%) Exposed Structure Granulation Quality: Pink Fascia Exposed:  No Necrotic Amount: Small (1-33%) Fat Layer (Subcutaneous Tissue) Exposed: Yes Necrotic Quality: Adherent Slough Tendon Exposed: No Muscle Exposed: No Joint Exposed: No Pincock, Shaniquia J. ([id number]) Bone Exposed: Yes Periwound Skin Texture Texture Color No Abnormalities Noted: No No Abnormalities Noted: No Callus: No Atrophie Blanche: No Crepitus: No Cyanosis: No Excoriation: No Ecchymosis: No Induration: No Erythema: No Rash: No Hemosiderin Staining: No Scarring: Yes Mottled: No Pallor: No Moisture Rubor: No No Abnormalities Noted: No Dry / Scaly: No Temperature / Pain Maceration: No Temperature: No Abnormality Tenderness on Palpation: Yes Wound Preparation Ulcer Cleansing: Rinsed/Irrigated with Saline Topical Anesthetic Applied: None, Other: hurricaine spray, Electronic Signature(s) Signed: 11/08/2017 4:42:41 PM By: [name] Signed: 11/27/2017 7:45:54 AM By: [name] Entered By: [name] on 11/08/2017 15:12:06 [name] ([id number]) -------------------------------------------------------------------------------- Vitals Details Patient Name: [name]. Date of Service: 11/08/2017 2:15 PM Medical Record Number: [id number] Patient Account Number: [id number] Date of Birth/Sex: [birth year] (84 y.o. F) Treating RN: [name] Primary Care Keaunna Skipper: [name] Other Clinician: Referring Chellsie Gomer: [name] Treating Alichia Alridge/Extender: [name], [name] in Treatment: 7 Vital Signs Time Taken: 14:58 Temperature (F): 98.3 Height (in): 61 Pulse (bpm): 73 Respiratory Rate (breaths/min): 18 Blood Pressure (mmHg): 109/60 Reference Range: 80 - 120 mg / dl Electronic Signature(s) Signed: 11/27/2017 7:45:54 AM By: [name] Entered By: [name] on 11/08/2017 14:59:13

## 2017-11-27 NOTE — Progress Notes (Signed)
*  PRELIMINARY RESULTS* Echocardiogram 2D Echocardiogram has been performed.  [name] 11/27/2017, 10:33 AM

## 2017-11-27 NOTE — Clinical Social Work Note (Signed)
Patient is ready for discharge and CSW has notified  Lorene Dy at [facility]. Discharge information sent as well. Patient aware of discharge and in agreement. CSW notified patient's son, Lorella Nimrod, of discharge. York Spaniel MSW,LCSW [phone]

## 2017-11-27 NOTE — Progress Notes (Signed)
Laury Axon to be D/C'd [place] per MD order.  Discussed prescriptions and follow up appointments with the patient. Prescriptions given to patient, medication list explained in detail. Pt verbalized understanding.  Allergies as of 11/27/2017   No Known Allergies     Medication List    STOP taking these medications   hydrochlorothiazide 25 MG tablet Commonly known as:  HYDRODIURIL   levofloxacin 500 MG tablet Commonly known as:  LEVAQUIN     TAKE these medications   acetaminophen 325 MG tablet Commonly known as:  TYLENOL Take 650 mg by mouth every 4 (four) hours as needed.   albuterol (2.5 MG/3ML) 0.083% nebulizer solution Commonly known as:  PROVENTIL Take 3 mLs by nebulization every 6 (six) hours as needed for wheezing or shortness of breath.   amLODipine 10 MG tablet Commonly known as:  NORVASC Take 10 mg by mouth daily.   B-12 1000 MCG/ML Kit Inject 1,000 mcg as directed every 30 (thirty) days.   ertapenem 500 mg in sodium chloride 0.9 % 50 mL Inject 500 mg into the vein daily for 13 days. Treat for 2 weeks end date June 12 Then discontinue PICC line Start taking on:  11/28/2017   multivitamin tablet Take 1 tablet by mouth daily.   ondansetron 4 MG tablet Commonly known as:  ZOFRAN Take 4 mg by mouth every 8 (eight) hours as needed for nausea or vomiting.   oxyCODONE-acetaminophen 5-325 MG tablet Commonly known as:  PERCOCET/ROXICET Take 1 tablet by mouth every 6 (six) hours as needed for severe pain.   pantoprazole 40 MG tablet Commonly known as:  PROTONIX Take 40 mg by mouth 2 (two) times daily.   pravastatin 40 MG tablet Commonly known as:  PRAVACHOL Take 40 mg by mouth daily.   pregabalin 50 MG capsule Commonly known as:  LYRICA Take 50 mg by mouth 3 (three) times daily.   protein supplement shake Liqd Commonly known as:  PREMIER PROTEIN Take 325 mLs (11 oz total) by mouth 2 (two) times daily between meals.   saccharomyces boulardii  250 MG capsule Commonly known as:  FLORASTOR Take 1 capsule (250 mg total) by mouth daily for 28 days.   Saline 0.65 % (Soln) Soln Place 2 sprays into the nose every 4 (four) hours as needed (NASAL CONGESTION).   vitamin C 250 MG tablet Commonly known as:  ASCORBIC ACID Take 250 mg by mouth daily.   zinc sulfate 50 MG Caps capsule Take 220 mg by mouth daily.       Vitals:   11/27/17 1130 11/27/17 1549  BP: 106/66 (!) 112/58  Pulse: 94 86  Resp:    Temp: 97.9 F (36.6 C) 97.7 F (36.5 C)  SpO2: 95% 99%    Skin clean, dry and intact without evidence of skin break down, no evidence of skin tears noted. IV catheter discontinued intact. Site without signs and symptoms of complications. Dressing and pressure applied. Pt denies pain at this time. No complaints noted. Pt d/c with PICC line for longterm ABX and colostomy that pt had prior to admission.   An After Visit Summary was printed and given to EMS to take to facility. Patient transported to group home via EMS.  Sharalyn Ink

## 2017-11-27 NOTE — Discharge Summary (Addendum)
[facility] [place] NAME: [name]    MR#:  [id number]  DATE OF BIRTH:  03-22-1929  DATE OF ADMISSION:  11/22/2017 ADMITTING PHYSICIAN: Avel Peace Salary, MD  DATE OF DISCHARGE: 11/27/2017  PRIMARY CARE PHYSICIAN: Coy Saunas, MD    ADMISSION DIAGNOSIS:  Hypokalemia [E87.6] Hypomagnesemia [E83.42] Prolonged Q-T interval on ECG [R94.31] Sepsis, due to unspecified organism ([place]) [A41.9] Pressure injury of sacral region, unstageable ([place]) [L89.150]  DISCHARGE DIAGNOSIS:  Active Problems:   Sepsis ([place])   Pressure injury of skin   Malnutrition of moderate degree   SECONDARY DIAGNOSIS:   Past Medical History:  Diagnosis Date  . Anemia   . Diabetes mellitus without complication [facility])     HOSPITAL COURSE:   84 year old female who is predominantly bedbound who presents from nursing home due to nausea and worsening of chronic sacral decubitus ulcer   1.  chronic infected sacral decubitus ulcer stage IV: Patient is status post debridement of chronic stage IV sacral coccygeal pressure wound Continue local wound care and repositioning a patient Patient was eval by surgery.  It does not appear that her chronic sacral wound was actually infected.  Twice daily NS dressings are ordered with instruction to turn and reposition patient despite being on a therapeutic mattress with low air loss feature. Bilateral heel boots are provided.  Her CBC is elevated however there is no source for elevation CBC.  She has not had any fevers.  She does not have diarrhea.  She has been on broad-spectrum antibiotics and has been covered for ESBL E. coli UTI.  We asked that PCP please repeat CBC in 2 days.  Case was discussed with Dr. Ola Spurr upon discharge.   2.  Severe electrolyte abnormalities due to protein  calorie malnutrition: Electrolytes have improved   3.  Elevated troponin on admission: This is due to demand ischemia.  Patient has ruled  out for ACS Appreciate cardiology consultation  4.  Protein calorie malnutrition: Continue dietary supplements  5.  Hyperlipidemia: Continue pravastatin 6.  Hyponatremia: Sodium level has improved and is stable.  HCTZ has been discontinued  7.  Essential hypertension: Patient will resume Norvasc  8.  Diabetes: We will resume ADA diet  9.  ESBL E. coli UTI: Patient has PICC line placed and will require Invanz for 2 weeks.  PICC line will then need to be discontinued after completion of antibiotics.   Stop date is June 12   10.  Atypical chest pain which is reproducible in nature The mild increased troponin was due to demand ischemia.  Her echocardiogram showed ejection fraction 65 to 70% she has no systolic or diastolic heart failure.  There is no wall motion abnormalities.  She was evaluated by cardiology.     DISCHARGE CONDITIONS AND DIET:   Stable for discharge on heart healthy diet/diabetic  CONSULTS OBTAINED:  Treatment Team:  Leonel Ramsay, MD Wellington Hampshire, MD  DRUG ALLERGIES:  No Known Allergies  DISCHARGE MEDICATIONS:   Allergies as of 11/27/2017   No Known Allergies     Medication List    STOP taking these medications   hydrochlorothiazide 25 MG tablet Commonly known as:  HYDRODIURIL   levofloxacin 500 MG tablet Commonly known as:  LEVAQUIN     TAKE these medications   acetaminophen 325 MG tablet Commonly known as:  TYLENOL Take 650 mg by mouth every 4 (four) hours as needed.   albuterol (2.5 MG/3ML) 0.083%  nebulizer solution Commonly known as:  PROVENTIL Take 3 mLs by nebulization every 6 (six) hours as needed for wheezing or shortness of breath.   amLODipine 10 MG tablet Commonly known as:  NORVASC Take 10 mg by mouth daily.   B-12 1000 MCG/ML Kit Inject 1,000 mcg as directed every 30 (thirty) days.   ertapenem 500 mg in sodium chloride 0.9 % 50 mL Inject 500 mg into the vein daily for 13 days. Treat for 2 weeks end date  June 12 Then discontinue PICC line Start taking on:  11/28/2017   multivitamin tablet Take 1 tablet by mouth daily.   ondansetron 4 MG tablet Commonly known as:  ZOFRAN Take 4 mg by mouth every 8 (eight) hours as needed for nausea or vomiting.   oxyCODONE-acetaminophen 5-325 MG tablet Commonly known as:  PERCOCET/ROXICET Take 1 tablet by mouth every 6 (six) hours as needed for severe pain.   pantoprazole 40 MG tablet Commonly known as:  PROTONIX Take 40 mg by mouth 2 (two) times daily.   pravastatin 40 MG tablet Commonly known as:  PRAVACHOL Take 40 mg by mouth daily.   pregabalin 50 MG capsule Commonly known as:  LYRICA Take 50 mg by mouth 3 (three) times daily.   protein supplement shake Liqd Commonly known as:  PREMIER PROTEIN Take 325 mLs (11 oz total) by mouth 2 (two) times daily between meals.   saccharomyces boulardii 250 MG capsule Commonly known as:  FLORASTOR Take 1 capsule (250 mg total) by mouth daily for 28 days.   Saline 0.65 % (Soln) Soln Place 2 sprays into the nose every 4 (four) hours as needed (NASAL CONGESTION).   vitamin C 250 MG tablet Commonly known as:  ASCORBIC ACID Take 250 mg by mouth daily.   zinc sulfate 50 MG Caps capsule Take 220 mg by mouth daily.         Today   CHIEF COMPLAINT:   Patient had PICC line placed this morning   VITAL SIGNS:  Blood pressure 106/66, pulse 94, temperature 97.9 F (36.6 C), temperature source Oral, resp. rate 20, height '5\' 1"'  (1.549 m), weight 65.4 kg (144 lb 3.2 oz), SpO2 95 %.   REVIEW OF SYSTEMS:  Review of Systems  Constitutional: Negative.  Negative for chills, fever and malaise/fatigue.  HENT: Negative.  Negative for ear discharge, ear pain, hearing loss, nosebleeds and sore throat.   Eyes: Negative.  Negative for blurred vision and pain.  Respiratory: Negative.  Negative for cough, hemoptysis, shortness of breath and wheezing.   Cardiovascular: Negative.  Negative for chest pain,  palpitations and leg swelling.  Gastrointestinal: Negative.  Negative for abdominal pain, blood in stool, diarrhea, nausea and vomiting.  Genitourinary: Negative.  Negative for dysuria.  Musculoskeletal: Negative.  Negative for back pain.  Skin: Negative.   Neurological: Negative for dizziness, tremors, speech change, focal weakness, seizures and headaches.  Endo/Heme/Allergies: Negative.  Does not bruise/bleed easily.  Psychiatric/Behavioral: Negative.  Negative for depression, hallucinations and suicidal ideas.     PHYSICAL EXAMINATION:  GENERAL:  85 y.o.-year-old patient lying in the bed with no acute distress.  NECK:  Supple, no jugular venous distention. No thyroid enlargement, no tenderness.  LUNGS: Normal breath sounds bilaterally, no wheezing, rales,rhonchi  No use of accessory muscles of respiration.  CARDIOVASCULAR: S1, S2 normal. No murmurs, rubs, or gallops.  ABDOMEN: Soft, non-tender, non-distended. Bowel sounds present. No organomegaly or mass.  EXTREMITIES: No pedal edema, cyanosis, or clubbing.  PSYCHIATRIC: The patient is  alert and oriented x 3.  SKIN: No obvious rash, lesion, or ulcer.   DATA REVIEW:   CBC Recent Labs  Lab 11/27/17 0601  WBC 25.2*  HGB 10.3*  HCT 30.7*  PLT 155    Chemistries  Recent Labs  Lab 11/25/17 0645 11/26/17 0606 11/27/17 0601  NA  --  132* 135  K  --  3.6 3.3*  CL  --  101 102  CO2  --  23 23  GLUCOSE  --  95 121*  BUN  --  32* 39*  CREATININE  --  1.36* 1.36*  CALCIUM  --  7.1* 7.9*  MG  --  2.0  --   AST 22  --   --   ALT 13*  --   --   ALKPHOS 80  --   --   BILITOT 0.6  --   --     Cardiac Enzymes Recent Labs  Lab 11/26/17 1123 11/26/17 1748 11/26/17 2306  TROPONINI 0.06* 0.19* 0.03*    Microbiology Results  '@[id number]' @  RADIOLOGY:  Dg Chest 1 View  Result Date: 11/26/2017 CLINICAL DATA:  Chest pain. EXAM: CHEST  1 VIEW COMPARISON:  11/22/2017. FINDINGS: Cardiomegaly with pulmonary vascular  prominence. Left base infiltrate/edema. Small left pleural effusion. No pneumothorax. No acute bony abnormality. IMPRESSION: 1. Left base infiltrate consistent with asymmetric edema and/or pneumonia. Small left pleural effusion. 2. Cardiomegaly with pulmonary vascular prominence. A component of CHF may be present. Electronically Signed   By: Marcello Moores  Register   On: 11/26/2017 12:51   Ir US Guide Vasc Access Left  Result Date: 11/27/2017 INDICATION: Sacral decubitus ulcer, osteomyelitis, concern for sepsis, access for antibiotics EXAM: ULTRASOUND AND FLUOROSCOPIC GUIDED PICC LINE INSERTION MEDICATIONS: 1% lidocaine local CONTRAST:  None FLUOROSCOPY TIME:  Eighteen seconds (1.0 mGy) COMPLICATIONS: None immediate. TECHNIQUE: The procedure, risks, benefits, and alternatives were explained to the patient and informed written consent was obtained. A timeout was performed prior to the initiation of the procedure. The left upper extremity was prepped with chlorhexidine in a sterile fashion, and a sterile drape was applied covering the operative field. Maximum barrier sterile technique with sterile gowns and gloves were used for the procedure. A timeout was performed prior to the initiation of the procedure. Local anesthesia was provided with 1% lidocaine. Under direct ultrasound guidance, the left brachial vein was accessed with a micropuncture kit after the overlying soft tissues were anesthetized with 1% lidocaine. An ultrasound image was saved for documentation purposes. A guidewire was advanced to the level of the superior caval-atrial junction for measurement purposes and the PICC line was cut to length. A peel-away sheath was placed and 5 [place], single lumen was inserted to level of the superior caval-atrial junction. A post procedure spot fluoroscopic was obtained. The catheter easily aspirated and flushed and was sutured in place. A dressing was placed. The patient tolerated the procedure well without immediate  post procedural complication. FINDINGS: After catheter placement, the tip lies within the superior cavoatrial junction. The catheter aspirates and flushes normally and is ready for immediate use. IMPRESSION: Successful ultrasound and fluoroscopic guided placement of a left brachial vein approach, 5 [place], single lumen PICC with tip at the superior caval-atrial junction. The PICC line is ready for immediate use. Electronically Signed   By: Jerilynn Mages.  Shick M.D.   On: 11/27/2017 10:37      Allergies as of 11/27/2017   No Known Allergies     Medication List    STOP  taking these medications   hydrochlorothiazide 25 MG tablet Commonly known as:  HYDRODIURIL   levofloxacin 500 MG tablet Commonly known as:  LEVAQUIN     TAKE these medications   acetaminophen 325 MG tablet Commonly known as:  TYLENOL Take 650 mg by mouth every 4 (four) hours as needed.   albuterol (2.5 MG/3ML) 0.083% nebulizer solution Commonly known as:  PROVENTIL Take 3 mLs by nebulization every 6 (six) hours as needed for wheezing or shortness of breath.   amLODipine 10 MG tablet Commonly known as:  NORVASC Take 10 mg by mouth daily.   B-12 1000 MCG/ML Kit Inject 1,000 mcg as directed every 30 (thirty) days.   ertapenem 500 mg in sodium chloride 0.9 % 50 mL Inject 500 mg into the vein daily for 13 days. Treat for 2 weeks end date June 12 Then discontinue PICC line Start taking on:  11/28/2017   multivitamin tablet Take 1 tablet by mouth daily.   ondansetron 4 MG tablet Commonly known as:  ZOFRAN Take 4 mg by mouth every 8 (eight) hours as needed for nausea or vomiting.   oxyCODONE-acetaminophen 5-325 MG tablet Commonly known as:  PERCOCET/ROXICET Take 1 tablet by mouth every 6 (six) hours as needed for severe pain.   pantoprazole 40 MG tablet Commonly known as:  PROTONIX Take 40 mg by mouth 2 (two) times daily.   pravastatin 40 MG tablet Commonly known as:  PRAVACHOL Take 40 mg by mouth daily.    pregabalin 50 MG capsule Commonly known as:  LYRICA Take 50 mg by mouth 3 (three) times daily.   protein supplement shake Liqd Commonly known as:  PREMIER PROTEIN Take 325 mLs (11 oz total) by mouth 2 (two) times daily between meals.   saccharomyces boulardii 250 MG capsule Commonly known as:  FLORASTOR Take 1 capsule (250 mg total) by mouth daily for 28 days.   Saline 0.65 % (Soln) Soln Place 2 sprays into the nose every 4 (four) hours as needed (NASAL CONGESTION).   vitamin C 250 MG tablet Commonly known as:  ASCORBIC ACID Take 250 mg by mouth daily.   zinc sulfate 50 MG Caps capsule Take 220 mg by mouth daily.          Management plans discussed with the patient and she is in agreement. Stable for discharge   Patient should follow up with pcp  CODE STATUS:     Code Status Orders  (From admission, onward)        Start     Ordered   11/22/17 1952  Full code  Continuous     11/22/17 1951    Code Status History    This patient has a current code status but no historical code status.      TOTAL TIME TAKING CARE OF THIS PATIENT: 38 minutes.    Note: This dictation was prepared with Dragon dictation along with smaller phrase technology. Any transcriptional errors that result from this process are unintentional.  Dustina Scoggin M.D on 11/27/2017 at 11:45 AM  Between 7am to 6pm - Pager - [phone] After 6pm go to [email or web address] - password [email or web address] [facility]  Office  [phone]  CC: Primary care physician; Coy Saunas, MD

## 2017-11-29 ENCOUNTER — Emergency Department: Payer: Medicare Other

## 2017-11-29 ENCOUNTER — Other Ambulatory Visit: Payer: Self-pay

## 2017-11-29 ENCOUNTER — Ambulatory Visit: Payer: Medicare Other | Admitting: Physician Assistant

## 2017-11-29 ENCOUNTER — Emergency Department
Admission: EM | Admit: 2017-11-29 | Discharge: 2017-11-29 | Disposition: A | Discharge status: Home / Self Care | Payer: Medicare Other | Attending: Emergency Medicine | Admitting: Emergency Medicine

## 2017-11-29 DIAGNOSIS — Z79899 Other long term (current) drug therapy: Secondary | ICD-10-CM | POA: Insufficient documentation

## 2017-11-29 DIAGNOSIS — E860 Dehydration: Secondary | ICD-10-CM | POA: Diagnosis not present

## 2017-11-29 DIAGNOSIS — J189 Pneumonia, unspecified organism: Secondary | ICD-10-CM

## 2017-11-29 DIAGNOSIS — R4182 Altered mental status, unspecified: Secondary | ICD-10-CM | POA: Diagnosis present

## 2017-11-29 DIAGNOSIS — E119 Type 2 diabetes mellitus without complications: Secondary | ICD-10-CM | POA: Insufficient documentation

## 2017-11-29 LAB — COMPREHENSIVE METABOLIC PANEL
ALBUMIN: 1.8 g/dL — AB (ref 3.5–5.0)
ALK PHOS: 79 U/L (ref 38–126)
ALT: 14 U/L (ref 14–54)
ANION GAP: 11 (ref 5–15)
AST: 27 U/L (ref 15–41)
BUN: 35 mg/dL — ABNORMAL HIGH (ref 6–20)
CALCIUM: 8.5 mg/dL — AB (ref 8.9–10.3)
CO2: 24 mmol/L (ref 22–32)
Chloride: 103 mmol/L (ref 101–111)
Creatinine, Ser: 1.28 mg/dL — ABNORMAL HIGH (ref 0.44–1.00)
GFR calc non Af Amer: 37 mL/min — ABNORMAL LOW (ref >60)
GFR, EST AFRICAN AMERICAN: 43 mL/min — AB (ref >60)
Glucose, Bld: 159 mg/dL — ABNORMAL HIGH (ref 65–99)
POTASSIUM: 3.6 mmol/L (ref 3.5–5.1)
Sodium: 138 mmol/L (ref 135–145)
Total Bilirubin: 0.7 mg/dL (ref 0.3–1.2)
Total Protein: 6.6 g/dL (ref 6.5–8.1)

## 2017-11-29 LAB — URINALYSIS, COMPLETE (UACMP) WITH MICROSCOPIC
BILIRUBIN URINE: NEGATIVE
GLUCOSE, UA: NEGATIVE mg/dL
KETONES UR: NEGATIVE mg/dL
LEUKOCYTES UA: NEGATIVE
Nitrite: NEGATIVE
PH: 5 (ref 5.0–8.0)
Protein, ur: 30 mg/dL — AB
Specific Gravity, Urine: 1.012 (ref 1.005–1.030)
Squamous Epithelial / LPF: NONE SEEN (ref 0–5)

## 2017-11-29 LAB — CBC WITH DIFFERENTIAL/PLATELET
Basophils Absolute: 0 10*3/uL (ref 0–0.1)
Basophils Relative: 0 %
EOS ABS: 0.2 10*3/uL (ref 0–0.7)
EOS PCT: 2 %
HCT: 29 % — ABNORMAL LOW (ref 35.0–47.0)
HEMOGLOBIN: 9.9 g/dL — AB (ref 12.0–16.0)
LYMPHS PCT: 14 %
Lymphs Abs: 1.5 10*3/uL (ref 1.0–3.6)
MCH: 30.1 pg (ref 26.0–34.0)
MCHC: 34.1 g/dL (ref 32.0–36.0)
MCV: 88.2 fL (ref 80.0–100.0)
MONO ABS: 1.5 10*3/uL — AB (ref 0.2–0.9)
Monocytes Relative: 14 %
NEUTROS PCT: 70 %
Neutro Abs: 7.2 10*3/uL — ABNORMAL HIGH (ref 1.4–6.5)
PLATELETS: 167 10*3/uL (ref 150–440)
RBC: 3.29 MIL/uL — AB (ref 3.80–5.20)
RDW: 18.8 % — ABNORMAL HIGH (ref 11.5–14.5)
WBC: 10.4 10*3/uL (ref 3.6–11.0)

## 2017-11-29 LAB — AEROBIC CULTURE W GRAM STAIN (SUPERFICIAL SPECIMEN)

## 2017-11-29 LAB — TROPONIN I: Troponin I: 0.03 ng/mL (ref <0.03)

## 2017-11-29 LAB — AEROBIC CULTURE  (SUPERFICIAL SPECIMEN): CULTURE: NO GROWTH

## 2017-11-29 MED ORDER — LEVOFLOXACIN 750 MG PO TABS: 750.0000 mg | ORAL_TABLET | Freq: Every day | ORAL | 0 refills | Status: AC

## 2017-11-29 MED ORDER — LEVOFLOXACIN IN D5W 750 MG/150ML IV SOLN
750.0000 mg | Freq: Once | INTRAVENOUS | Status: AC
  Administered 2017-11-29: 750 mg via INTRAVENOUS
  Filled 2017-11-29: qty 150

## 2017-11-29 MED ORDER — SODIUM CHLORIDE 0.9 % IV BOLUS
500.0000 mL | Freq: Once | INTRAVENOUS | Status: AC
  Administered 2017-11-29: 500 mL via INTRAVENOUS

## 2017-11-29 NOTE — ED Notes (Addendum)
Report called to [employer] [employer].   Brief changed.

## 2017-11-29 NOTE — ED Triage Notes (Addendum)
Pt to ED from [facility] in [place] c/o altered mental status. Staff report that patient was difficult to arouse this AM even with sternal rub. Pt denies any complaints and is alert and oriented X4 at this time. Pt does not know why she is here because she states that she feels fine. Patient reports that sometimes she "does this" because she sleeps heavily. EMS reports that she was easily arousable to voice when arriving at facility and A&0X4.   CBG and VSS with EMS.

## 2017-11-29 NOTE — ED Notes (Signed)
Back to facility with all of belongings via [facility]. PICC line patient arrived with to left arm still in place. Pt clean and dry. Ostomy bag with minimal draining in it.

## 2017-11-29 NOTE — ED Provider Notes (Signed)
[facility] Emergency Department Provider Note ____________________________________________   First MD Initiated Contact with Patient 11/29/17 1206     (approximate)  I have reviewed the triage vital signs and the nursing notes.   HISTORY  Chief Complaint Altered Mental Status    HPI [name] is a 84 y.o. female with PMH as noted below who presents from her nursing home for apparent altered mental status.  Per EMS, patient was difficult to arouse this morning, and did not wake up initially even with sternal rub.  She was fully alert for EMS, and remains so at this time.  The patient denies any acute complaints and states that she feels fine.  Past Medical History:  Diagnosis Date  . Anemia   . Diabetes mellitus without complication [facility])     Patient Active Problem List   Diagnosis Date Noted  . Malnutrition of moderate degree 11/26/2017  . Pressure injury of skin 11/23/2017  . Sepsis ([place]) 11/22/2017  . JOINT EFFUSION, LEFT KNEE 04/19/2008  . METHICILLIN RESISTANT STAPHYLOCOCCUS AUREUS INFECTION 02/19/2008  . PROSTHETIC JOINT COMPLICATION 45/36/4680  . SPLENECTOMY, HX OF 02/19/2008    Past Surgical History:  Procedure Laterality Date  . COLOSTOMY    . IR US GUIDE VASC ACCESS LEFT  11/27/2017    Prior to Admission medications   Medication Sig Start Date End Date Taking? Authorizing Provider  acetaminophen (TYLENOL) 325 MG tablet Take 650 mg by mouth every 4 (four) hours as needed.    [provider]  albuterol (PROVENTIL) (2.5 MG/3ML) 0.083% nebulizer solution Take 3 mLs by nebulization every 6 (six) hours as needed for wheezing or shortness of breath.    [provider]  amLODipine (NORVASC) 10 MG tablet Take 10 mg by mouth daily.    [provider]  Cyanocobalamin (B-12) 1000 MCG/ML KIT Inject 1,000 mcg as directed every 30 (thirty) days.    [provider]  ertapenem 500 mg in sodium chloride 0.9 % 50  mL Inject 500 mg into the vein daily for 13 days. Treat for 2 weeks end date June 12 Then discontinue PICC line 11/28/17 12/11/17  Bettey Costa, MD  levofloxacin (LEVAQUIN) 750 MG tablet Take 1 tablet (750 mg total) by mouth daily for 7 days. 11/30/17 12/07/17  Arta Silence, MD  Multiple Vitamin (MULTIVITAMIN) tablet Take 1 tablet by mouth daily.    [provider]  ondansetron (ZOFRAN) 4 MG tablet Take 4 mg by mouth every 8 (eight) hours as needed for nausea or vomiting.    [provider]  oxyCODONE-acetaminophen (PERCOCET/ROXICET) 5-325 MG tablet Take 1 tablet by mouth every 6 (six) hours as needed for severe pain.    [provider]  pantoprazole (PROTONIX) 40 MG tablet Take 40 mg by mouth 2 (two) times daily.    [provider]  pravastatin (PRAVACHOL) 40 MG tablet Take 40 mg by mouth daily.    [provider]  pregabalin (LYRICA) 50 MG capsule Take 50 mg by mouth 3 (three) times daily.    [provider]  protein supplement shake (PREMIER PROTEIN) LIQD Take 325 mLs (11 oz total) by mouth 2 (two) times daily between meals. 11/27/17 12/28/17  Bettey Costa, MD  saccharomyces boulardii (FLORASTOR) 250 MG capsule Take 1 capsule (250 mg total) by mouth daily for 28 days. 11/27/17 12/25/17  Bettey Costa, MD  Saline 0.65 % (Soln) SOLN Place 2 sprays into the nose every 4 (four) hours as needed (NASAL CONGESTION).  [provider]  vitamin C (ASCORBIC ACID) 250 MG tablet Take 250 mg by mouth daily.    [provider]  zinc sulfate 50 MG CAPS capsule Take 220 mg by mouth daily.    [provider]    Allergies Patient has no known allergies.  No family history on file.  Social History Social History   Tobacco Use  . Smoking status: Never Smoker  . Smokeless tobacco: Never Used  Substance Use Topics  . Alcohol use: Never    Frequency: Never  . Drug use: Never    Review of Systems  Constitutional: No  fever. Eyes: No redness. ENT: No sore throat. Cardiovascular: Denies chest pain. Respiratory: Denies shortness of breath. Gastrointestinal: No vomiting or abdominal pain. Genitourinary: Negative for dysuria.  Musculoskeletal: Negative for back pain. Skin: Negative for rash. Neurological: Negative for headache.  Positive for resolved altered mental status.   ____________________________________________   PHYSICAL EXAM:  VITAL SIGNS: ED Triage Vitals [11/29/17 1155]  Enc Vitals Group     BP 113/83     Pulse Rate 93     Resp 18     Temp 97.6 F (36.4 C)     Temp Source Oral     SpO2 93 %     Weight 145 lb (65.8 kg)     Height 5' 1" (1.549 m)     Head Circumference      Peak Flow      Pain Score 0     Pain Loc      Pain Edu?      Excl. in GC?     Constitutional: Alert and oriented x4.  Relatively comfortable appearing and in no acute distress. Eyes: Conjunctivae are normal.  EOMI.  PERRLA. Head: Atraumatic. Nose: No congestion/rhinnorhea. Mouth/Throat: Mucous membranes are dry.   Neck: Normal range of motion.  Cardiovascular: Normal rate, regular rhythm. Grossly normal heart sounds.  Good peripheral circulation. Respiratory: Normal respiratory effort.  No retractions.  Slightly decreased breath sounds bilaterally. Gastrointestinal: Soft and nontender. No distention.  Genitourinary: No flank tenderness. Musculoskeletal: No lower extremity edema.  Extremities warm and well perfused.  Neurologic:  Normal speech and language.  Motor intact in all extremities.  Normal coordination.  No gross focal neurologic deficits are appreciated.  Skin:  Skin is warm and dry. No rash noted. Psychiatric: Mood and affect are normal.   ____________________________________________   LABS (all labs ordered are listed, but only abnormal results are displayed)  Labs Reviewed  COMPREHENSIVE METABOLIC PANEL - Abnormal; Notable for the following components:      Result Value   Glucose,  Bld 159 (*)    BUN 35 (*)    Creatinine, Ser 1.28 (*)    Calcium 8.5 (*)    Albumin 1.8 (*)    GFR calc non Af Amer 37 (*)    GFR calc Af Amer 43 (*)    All other components within normal limits  TROPONIN I - Abnormal; Notable for the following components:   Troponin I 0.03 (*)    All other components within normal limits  CBC WITH DIFFERENTIAL/PLATELET - Abnormal; Notable for the following components:   RBC 3.29 (*)    Hemoglobin 9.9 (*)    HCT 29.0 (*)    RDW 18.8 (*)    Neutro Abs 7.2 (*)    Monocytes Absolute 1.5 (*)    All other components within normal limits  URINALYSIS, COMPLETE (UACMP) WITH MICROSCOPIC - Abnormal; Notable for the   following components:   Color, Urine YELLOW (*)    APPearance CLEAR (*)    Hgb urine dipstick MODERATE (*)    Protein, ur 30 (*)    Bacteria, UA RARE (*)    All other components within normal limits   ____________________________________________  EKG  ED ECG REPORT I, Sebastian Siadecki, the attending physician, personally viewed and interpreted this ECG.  Date: 11/29/2017 EKG Time: 37 Rate: 91 Rhythm: normal sinus rhythm QRS Axis: normal Intervals: RBBB ST/T Wave abnormalities: normal Narrative Interpretation: no evidence of acute ischemia; no significant change when compared to EKG of 11/22/2017  ____________________________________________  RADIOLOGY  CXR: Possible worsening left middle and lower lung opacity  ____________________________________________   PROCEDURES  Procedure(s) performed: No  Procedures  Critical Care performed: No ____________________________________________   INITIAL IMPRESSION / ASSESSMENT AND PLAN / ED COURSE  Pertinent labs & imaging results that were available during my care of the patient were reviewed by me and considered in my medical decision making (see chart for details).  88-year-old female with PMH as noted above presents with apparent altered mental status, with facility staff  reporting that she was difficult to arouse this morning.  She is currently ANO x3 and denies any acute complaints.  I reviewed the past medical records in epic; the patient was admitted earlier this week for sepsis and electrolyte abnormalities.  On exam, the patient is relatively comfortable appearing, vital signs are normal, and the remainder of the exam is unremarkable except for dry mucous membranes.  Differential includes dehydration, recurrent electrolyte abnormality, recurrent infection/sepsis, or syncope.  I have a low suspicion for cardiac etiology.  There is no evidence of CNS cause, given patient's resolved symptoms and nonfocal neuro exam.  If work-up is negative, I anticipate discharge back to her facility  ----------------------------------------- 2:33 PM on 11/29/2017 -----------------------------------------  The patient has remained alert throughout her ED stay.  Her vital signs are stable.  Her lab work-up is unremarkable except for UA showing some RBCs and hemoglobin but no findings to suggest UTI.  Her CBC and CMP are all within her normal range, and her troponin is minimally elevated but down from her admission in the last week.  The chest x-ray shows possible worsening infiltrate in the left lung.  This was present on her chest x-ray performed 2 days ago and she was not treated at that time.  Given her stable vital signs, lack of hypoxia, no evidence of sepsis, and otherwise reassuring work-up, I feel that it would be appropriate to give the patient empiric outpatient treatment.  Although she was recently admitted, given that she had negative cultures, has no evidence of sepsis, no WBC elevation and questionable x-ray findings, I do not think that she requires inpatient treatment for HCAP.  The patient herself would strongly prefer to go home if at all possible.  I discussed the results of the work-up with her, and the information will be given to her nursing home.  Return  precautions were given to the patient and she expressed understanding.  They have been provided in the discharge paperwork.   ____________________________________________   FINAL CLINICAL IMPRESSION(S) / ED DIAGNOSES  Final diagnoses:  Dehydration  Healthcare-associated pneumonia      NEW MEDICATIONS STARTED DURING THIS VISIT:  New Prescriptions   LEVOFLOXACIN (LEVAQUIN) 750 MG TABLET    Take 1 tablet (750 mg total) by mouth daily for 7 days.     Note:  This document was prepared using Dragon voice recognition   software and may include unintentional dictation errors.    Siadecki, Sebastian, MD 11/29/17 1436  

## 2017-11-29 NOTE — Discharge Instructions (Addendum)
Based on the work-up, it is possible that [name] is developing a pneumonia.  However, her vital signs are stable and she has remained alert in the emergency department.  She is stable for discharge with oral antibiotics.  She should return to the emergency department for new or worsening weakness, change in mental status, fevers, difficulty breathing, or inability to take the medication prescribed.  She should follow-up with her regular doctor in 1 week.

## 2017-11-29 NOTE — ED Notes (Signed)
Date and time results received: 11/29/17 1:42 PM   Test: troponin Critical Value: 0.03  Name of Provider Notified: Dr. Dionne Bucy

## 2017-11-29 NOTE — ED Notes (Signed)
Sacral wound covered in dressing. no obvious signs of infection.

## 2017-12-03 DIAGNOSIS — L89154 Pressure ulcer of sacral region, stage 4: Secondary | ICD-10-CM

## 2017-12-30 DEATH — deceased

## 2018-08-01 IMAGING — DX DG CHEST 1V PORT
1 series · 1 of 1 positions shown · non-contrast
Comparison: 11/22/2017, 09/05/2009

CLINICAL DATA: Sepsis

EXAM:
PORTABLE CHEST 1 VIEW

[chest ap]
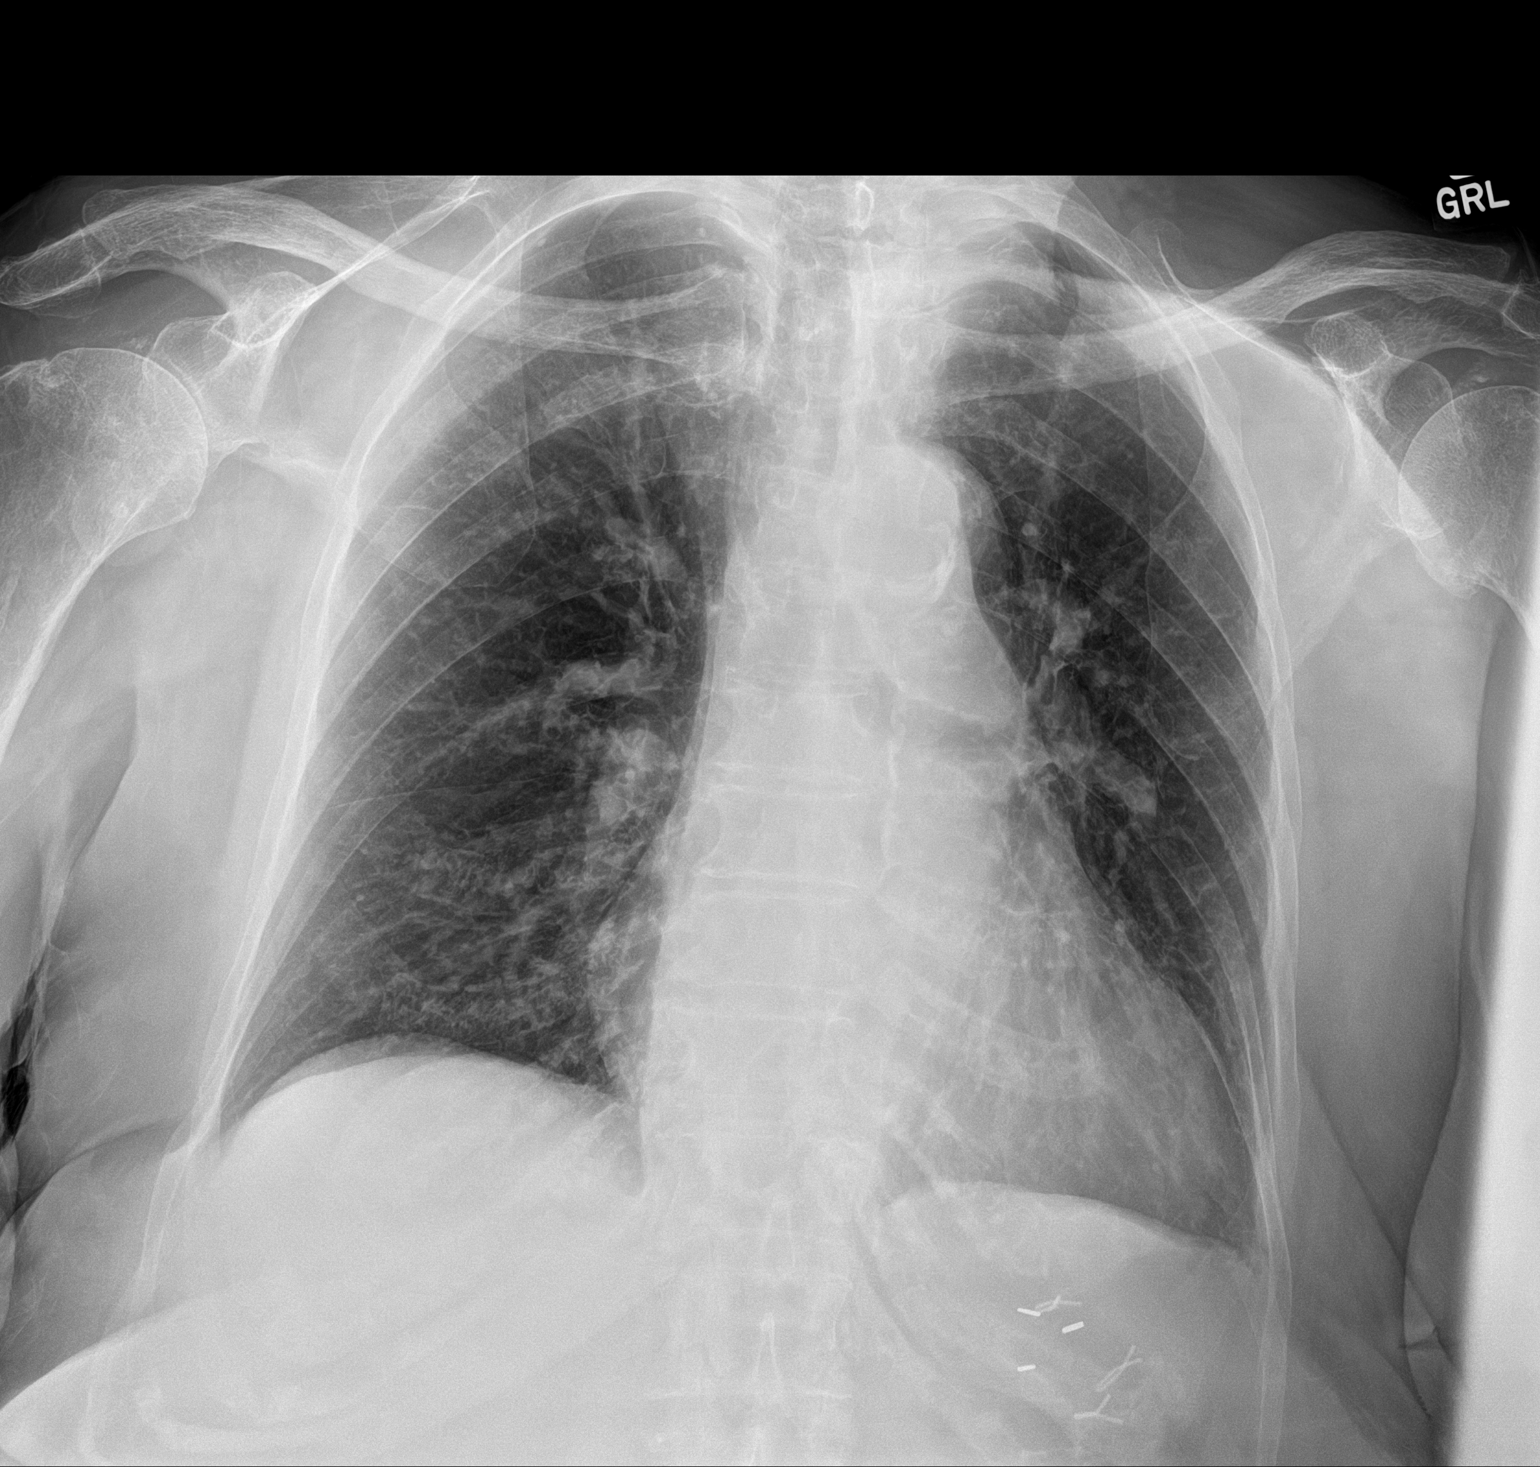

[1 of 1 positions shown; findings below may reference images not displayed]

FINDINGS: Mild diffuse interstitial opacity likely chronic change. No focal
consolidation or effusion. Mild cardiomegaly. Aortic
atherosclerosis. No pneumothorax. Surgical clips in the left upper
quadrant.
IMPRESSION: No active disease.  Cardiomegaly

## 2018-08-06 IMAGING — US IR US GUIDE VASC ACCESS LEFT
1 series · 4 of 4 positions shown · IV contrast (agent unspecified)
Comparison: none

INDICATION: Sacral decubitus ulcer, osteomyelitis, concern for sepsis, access
for antibiotics

EXAM:
ULTRASOUND AND FLUOROSCOPIC GUIDED PICC LINE INSERTION
MEDICATIONS:
1% lidocaine local
CONTRAST:  None
FLUOROSCOPY TIME:  Eighteen seconds (1.0 mGy)
COMPLICATIONS:
None immediate.
TECHNIQUE: The procedure, risks, benefits, and alternatives were explained to
the patient and informed written consent was obtained. A timeout was
performed prior to the initiation of the procedure.

[Series 1: ir us guide vasc access left · 4 of 4 slices shown]
[im 1/4]
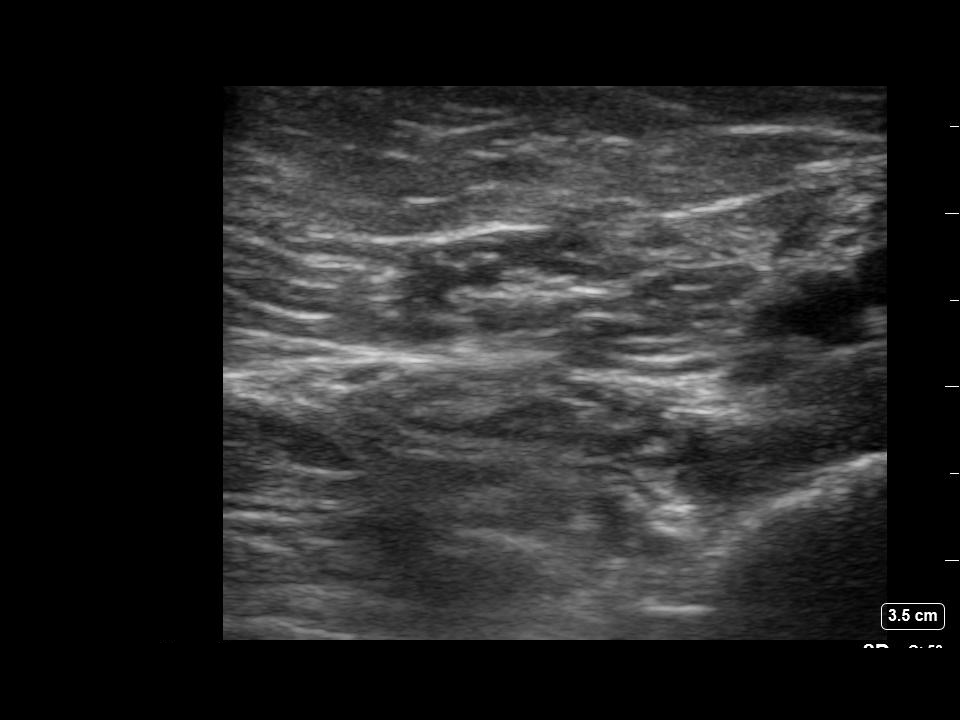
[im 2/4]
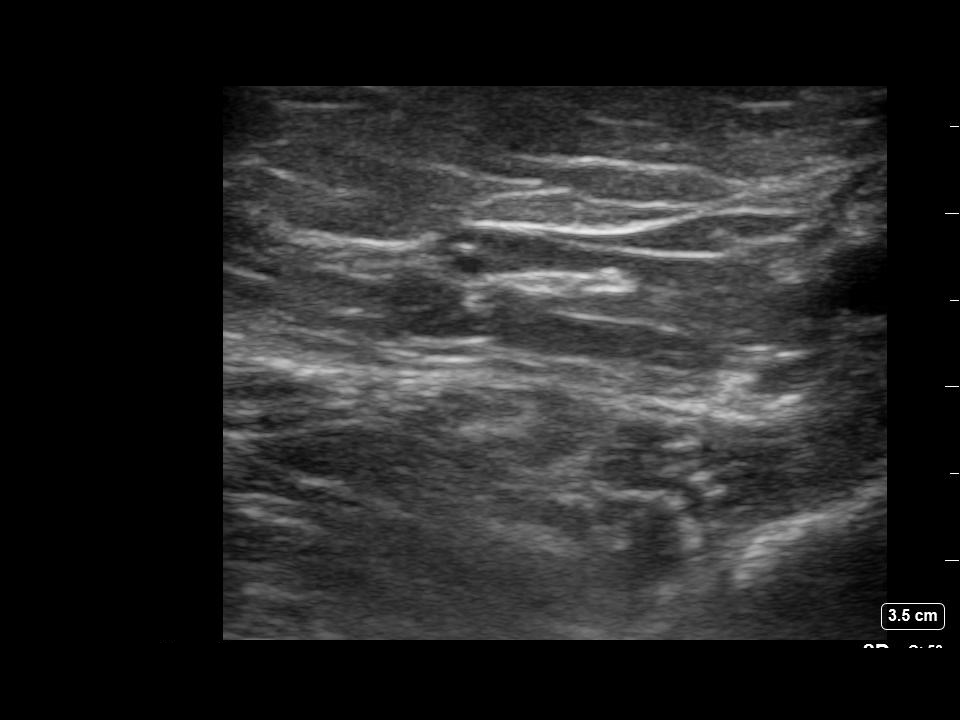
[im 3/4]
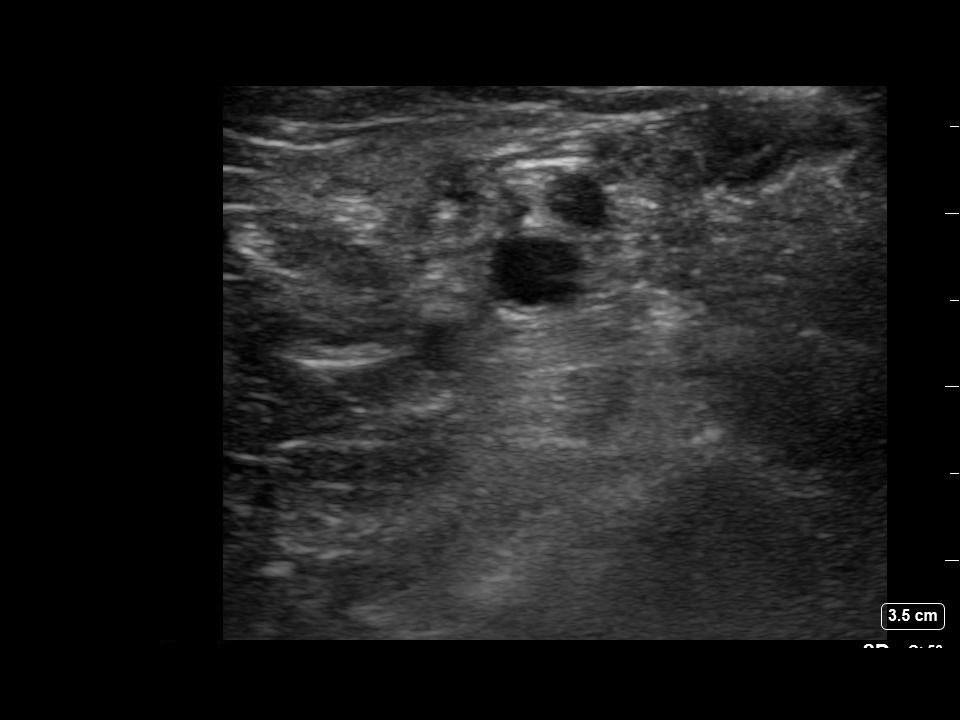
[im 4/4]
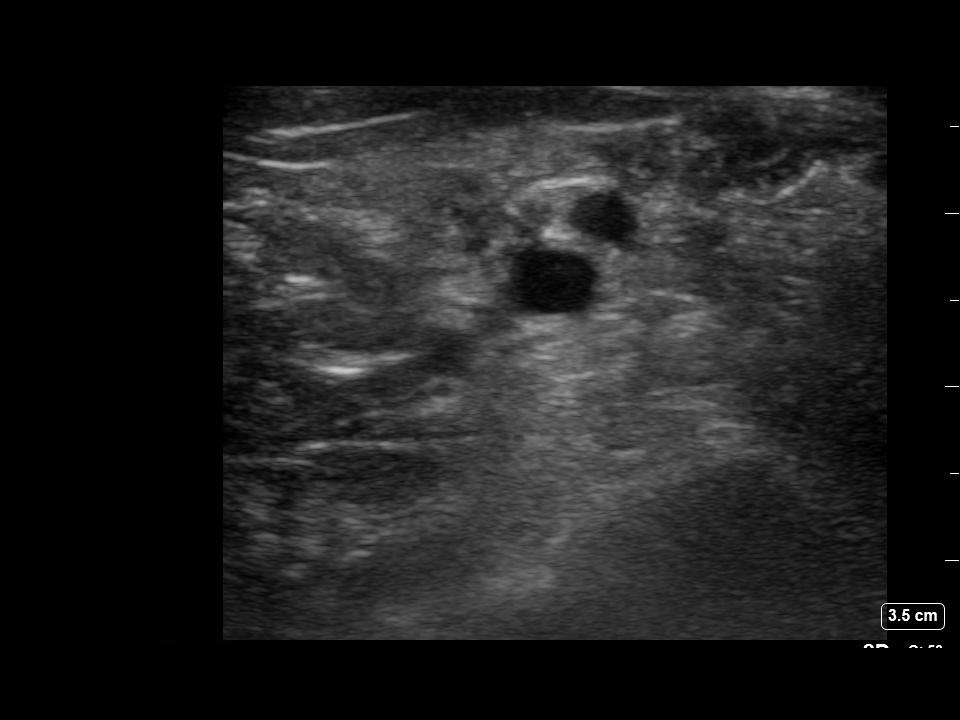

[4 of 4 positions shown; findings below may reference images not displayed]

The left upper extremity was prepped with chlorhexidine in a sterile
fashion, and a sterile drape was applied covering the operative
field. Maximum barrier sterile technique with sterile gowns and
gloves were used for the procedure. A timeout was performed prior to
the initiation of the procedure. Local anesthesia was provided with
1% lidocaine.

Under direct ultrasound guidance, the left brachial vein was
accessed with a micropuncture kit after the overlying soft tissues
were anesthetized with 1% lidocaine. An ultrasound image was saved
for documentation purposes. A guidewire was advanced to the level of
the superior caval-atrial junction for measurement purposes and the
PICC line was cut to length. A peel-away sheath was placed and 5
French, single lumen was inserted to level of the superior
caval-atrial junction. A post procedure spot fluoroscopic was
obtained. The catheter easily aspirated and flushed and was sutured
in place. A dressing was placed. The patient tolerated the procedure
well without immediate post procedural complication.
FINDINGS: After catheter placement, the tip lies within the superior
cavoatrial junction. The catheter aspirates and flushes normally and
is ready for immediate use.
IMPRESSION: Successful ultrasound and fluoroscopic guided placement of a left
brachial vein approach, 5 French, single lumen PICC with tip at the
superior caval-atrial junction. The PICC line is ready for immediate
use.

## 2018-08-06 IMAGING — XA IR US GUIDE VASC ACCESS LEFT
1 series · 1 of 1 positions shown · IV contrast (agent unspecified)
Comparison: none

INDICATION: Sacral decubitus ulcer, osteomyelitis, concern for sepsis, access
for antibiotics

EXAM:
ULTRASOUND AND FLUOROSCOPIC GUIDED PICC LINE INSERTION
MEDICATIONS:
1% lidocaine local
CONTRAST:  None
FLUOROSCOPY TIME:  Eighteen seconds (1.0 mGy)
COMPLICATIONS:
None immediate.
TECHNIQUE: The procedure, risks, benefits, and alternatives were explained to
the patient and informed written consent was obtained. A timeout was
performed prior to the initiation of the procedure.

[Series 1: fl - angio · 1 of 1 slices shown]
[im 1/1]
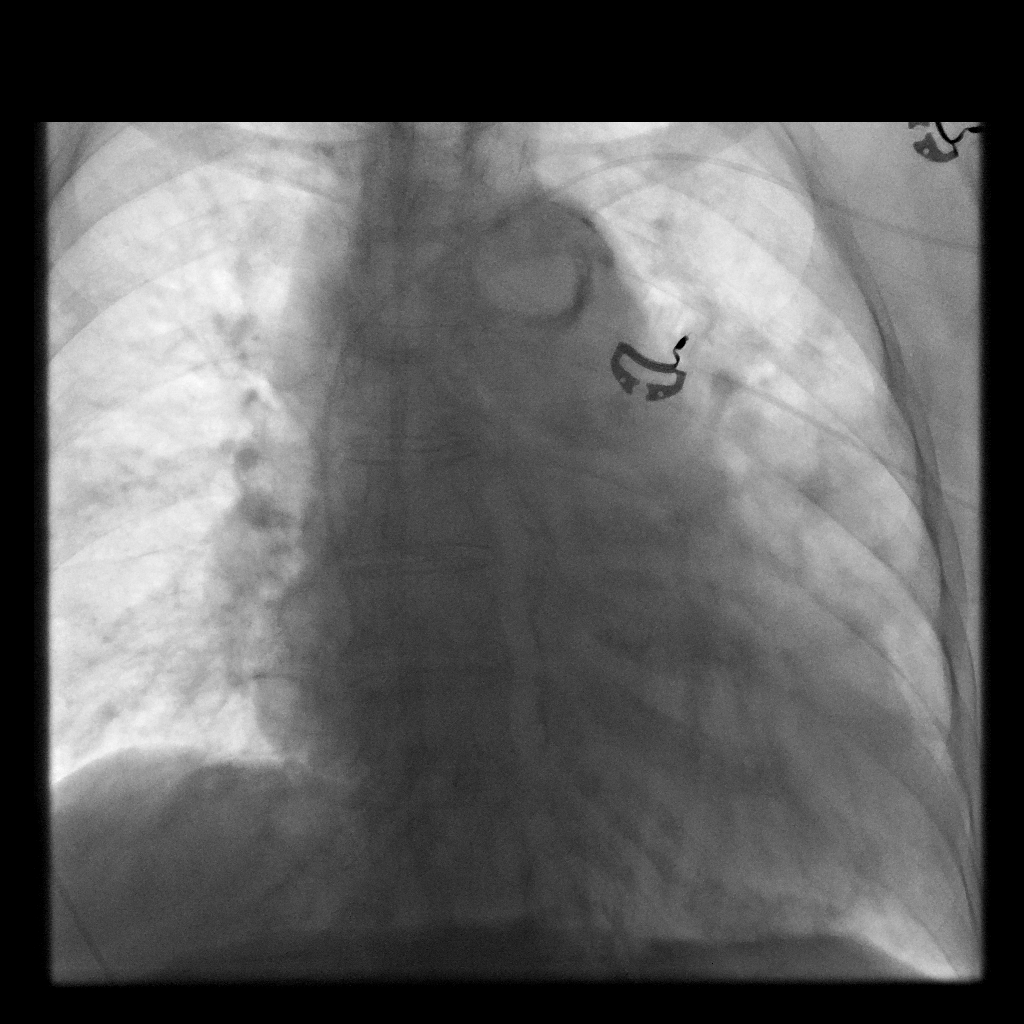

[1 of 1 positions shown; findings below may reference images not displayed]

The left upper extremity was prepped with chlorhexidine in a sterile
fashion, and a sterile drape was applied covering the operative
field. Maximum barrier sterile technique with sterile gowns and
gloves were used for the procedure. A timeout was performed prior to
the initiation of the procedure. Local anesthesia was provided with
1% lidocaine.

Under direct ultrasound guidance, the left brachial vein was
accessed with a micropuncture kit after the overlying soft tissues
were anesthetized with 1% lidocaine. An ultrasound image was saved
for documentation purposes. A guidewire was advanced to the level of
the superior caval-atrial junction for measurement purposes and the
PICC line was cut to length. A peel-away sheath was placed and 5
French, single lumen was inserted to level of the superior
caval-atrial junction. A post procedure spot fluoroscopic was
obtained. The catheter easily aspirated and flushed and was sutured
in place. A dressing was placed. The patient tolerated the procedure
well without immediate post procedural complication.
FINDINGS: After catheter placement, the tip lies within the superior
cavoatrial junction. The catheter aspirates and flushes normally and
is ready for immediate use.
IMPRESSION: Successful ultrasound and fluoroscopic guided placement of a left
brachial vein approach, 5 French, single lumen PICC with tip at the
superior caval-atrial junction. The PICC line is ready for immediate
use.
